# Patient Record
Sex: Female | Born: 1985 | Race: White | Hispanic: No | Marital: Married | Smoking: Former smoker
Health system: Southern US, Community
[De-identification: ages and names within clinical notes are randomized; demographics above are authoritative.]

## PROBLEM LIST (undated history)

## (undated) ENCOUNTER — Inpatient Hospital Stay (HOSPITAL_COMMUNITY): Payer: Self-pay

## (undated) DIAGNOSIS — F32A Depression, unspecified: Secondary | ICD-10-CM

## (undated) DIAGNOSIS — F419 Anxiety disorder, unspecified: Secondary | ICD-10-CM

## (undated) DIAGNOSIS — F319 Bipolar disorder, unspecified: Secondary | ICD-10-CM

## (undated) DIAGNOSIS — Z87442 Personal history of urinary calculi: Secondary | ICD-10-CM

## (undated) DIAGNOSIS — F329 Major depressive disorder, single episode, unspecified: Secondary | ICD-10-CM

---

## 2006-10-11 ENCOUNTER — Other Ambulatory Visit: Admission: RE | Admit: 2006-10-11 | Discharge: 2006-10-11 | Payer: Self-pay | Admitting: Obstetrics and Gynecology

## 2007-01-09 ENCOUNTER — Ambulatory Visit (HOSPITAL_COMMUNITY): Admission: RE | Admit: 2007-01-09 | Discharge: 2007-01-09 | Payer: Self-pay | Admitting: Obstetrics and Gynecology

## 2007-03-15 ENCOUNTER — Inpatient Hospital Stay (HOSPITAL_COMMUNITY): Admission: AD | Admit: 2007-03-15 | Discharge: 2007-03-15 | Payer: Self-pay | Admitting: Obstetrics and Gynecology

## 2007-05-14 ENCOUNTER — Inpatient Hospital Stay (HOSPITAL_COMMUNITY): Admission: AD | Admit: 2007-05-14 | Discharge: 2007-05-14 | Payer: Self-pay | Admitting: Family Medicine

## 2007-05-26 ENCOUNTER — Inpatient Hospital Stay (HOSPITAL_COMMUNITY): Admission: AD | Admit: 2007-05-26 | Discharge: 2007-05-29 | Payer: Self-pay | Admitting: Obstetrics and Gynecology

## 2008-08-15 ENCOUNTER — Emergency Department (HOSPITAL_COMMUNITY): Admission: EM | Admit: 2008-08-15 | Discharge: 2008-08-15 | Payer: Self-pay | Admitting: Emergency Medicine

## 2009-02-01 DIAGNOSIS — Z87442 Personal history of urinary calculi: Secondary | ICD-10-CM

## 2009-02-01 HISTORY — DX: Personal history of urinary calculi: Z87.442

## 2009-05-14 ENCOUNTER — Inpatient Hospital Stay (HOSPITAL_COMMUNITY): Admission: AD | Admit: 2009-05-14 | Discharge: 2009-05-14 | Payer: Self-pay | Admitting: Obstetrics & Gynecology

## 2009-05-31 ENCOUNTER — Inpatient Hospital Stay (HOSPITAL_COMMUNITY): Admission: AD | Admit: 2009-05-31 | Discharge: 2009-05-31 | Payer: Self-pay | Admitting: Obstetrics and Gynecology

## 2009-06-05 ENCOUNTER — Inpatient Hospital Stay (HOSPITAL_COMMUNITY): Admission: AD | Admit: 2009-06-05 | Discharge: 2009-06-06 | Payer: Self-pay | Admitting: Obstetrics & Gynecology

## 2009-12-02 HISTORY — PX: KIDNEY STONE SURGERY: SHX686

## 2009-12-16 ENCOUNTER — Observation Stay (HOSPITAL_COMMUNITY): Admission: AD | Admit: 2009-12-16 | Discharge: 2009-12-17 | Payer: Self-pay | Admitting: Urology

## 2009-12-16 ENCOUNTER — Emergency Department (HOSPITAL_COMMUNITY): Admission: EM | Admit: 2009-12-16 | Discharge: 2009-12-16 | Payer: Self-pay | Admitting: Emergency Medicine

## 2009-12-17 ENCOUNTER — Encounter (INDEPENDENT_AMBULATORY_CARE_PROVIDER_SITE_OTHER): Payer: Self-pay | Admitting: Urology

## 2010-02-01 ENCOUNTER — Emergency Department (HOSPITAL_COMMUNITY)
Admission: EM | Admit: 2010-02-01 | Discharge: 2010-02-01 | Payer: Self-pay | Source: Home / Self Care | Admitting: Emergency Medicine

## 2010-02-01 NOTE — L&D Delivery Note (Signed)
Delivery Note At 6:12 PM a viable female was delivered via Vaginal, Spontaneous Delivery (Presentation: Left Occiput Anterior).  APGAR: 9, 9; weight 7 lb 11.3 oz (3496 g).   Placenta status: Intact, Spontaneous.  Cord: 3 vessels  Pt presented to L&D from mau and found to be anterior lip with bulging bag.  Arom for clear fluid. Pt delivered with one push after arom. Infant was passed to the maternal chest and cried vigorously.  Placenta delivered spontaneously, intact, with 3VC.  No lacerations required repair.  Mother and baby doing well after delivery. 10mg  IM pitocin given d/t no IV access at time of delivery  Anesthesia: None  Episiotomy: None Lacerations: None Est. Blood Loss (mL): 250  Mom to postpartum.  Baby to nursery-stable.  Misty Gentry H. 11/27/2010, 6:26 PM

## 2010-04-07 ENCOUNTER — Other Ambulatory Visit: Payer: Self-pay | Admitting: Obstetrics and Gynecology

## 2010-04-13 LAB — URINALYSIS, ROUTINE W REFLEX MICROSCOPIC
Bilirubin Urine: NEGATIVE
Nitrite: NEGATIVE
Specific Gravity, Urine: 1.023 (ref 1.005–1.030)
pH: 8 (ref 5.0–8.0)

## 2010-04-13 LAB — URINE MICROSCOPIC-ADD ON

## 2010-04-13 LAB — HCG, QUANTITATIVE, PREGNANCY: hCG, Beta Chain, Quant, S: 4 m[IU]/mL (ref <5)

## 2010-04-14 LAB — URINALYSIS, ROUTINE W REFLEX MICROSCOPIC
Bilirubin Urine: NEGATIVE
Glucose, UA: NEGATIVE mg/dL
Ketones, ur: NEGATIVE mg/dL
Nitrite: NEGATIVE
Specific Gravity, Urine: 1.013 (ref 1.005–1.030)
pH: 6.5 (ref 5.0–8.0)

## 2010-04-14 LAB — CBC
HCT: 36.9 % (ref 36.0–46.0)
Hemoglobin: 13.1 g/dL (ref 12.0–15.0)
MCH: 31.3 pg (ref 26.0–34.0)
MCHC: 35.4 g/dL (ref 30.0–36.0)
RBC: 4.17 MIL/uL (ref 3.87–5.11)

## 2010-04-14 LAB — BASIC METABOLIC PANEL
CO2: 24 mEq/L (ref 19–32)
Chloride: 104 mEq/L (ref 96–112)
GFR calc Af Amer: 35 mL/min — ABNORMAL LOW (ref >60)
Glucose, Bld: 102 mg/dL — ABNORMAL HIGH (ref 70–99)
Potassium: 4 mEq/L (ref 3.5–5.1)
Sodium: 138 mEq/L (ref 135–145)

## 2010-04-14 LAB — STONE ANALYSIS: Stone Weight KSTONE: 0.02 g

## 2010-04-14 LAB — URINE MICROSCOPIC-ADD ON

## 2010-04-14 LAB — URINE CULTURE

## 2010-04-21 LAB — CBC
HCT: 34.4 % — ABNORMAL LOW (ref 36.0–46.0)
HCT: 39.3 % (ref 36.0–46.0)
Hemoglobin: 13.8 g/dL (ref 12.0–15.0)
MCHC: 35.2 g/dL (ref 30.0–36.0)
MCHC: 35.5 g/dL (ref 30.0–36.0)
MCV: 91.6 fL (ref 78.0–100.0)
RBC: 3.75 MIL/uL — ABNORMAL LOW (ref 3.87–5.11)
RBC: 4.28 MIL/uL (ref 3.87–5.11)
RDW: 12.9 % (ref 11.5–15.5)
WBC: 11.9 10*3/uL — ABNORMAL HIGH (ref 4.0–10.5)

## 2010-04-22 LAB — WET PREP, GENITAL: Yeast Wet Prep HPF POC: NONE SEEN

## 2010-04-22 LAB — RPR: RPR: NONREACTIVE

## 2010-04-22 LAB — ANTIBODY SCREEN: Antibody Screen: NEGATIVE

## 2010-04-30 ENCOUNTER — Inpatient Hospital Stay (HOSPITAL_COMMUNITY)
Admission: AD | Admit: 2010-04-30 | Discharge: 2010-04-30 | Disposition: A | Discharge status: Home / Self Care | Payer: Medicaid Other | Source: Ambulatory Visit | Attending: Obstetrics and Gynecology | Admitting: Obstetrics and Gynecology

## 2010-04-30 DIAGNOSIS — O99891 Other specified diseases and conditions complicating pregnancy: Secondary | ICD-10-CM | POA: Insufficient documentation

## 2010-04-30 DIAGNOSIS — R109 Unspecified abdominal pain: Secondary | ICD-10-CM | POA: Insufficient documentation

## 2010-04-30 LAB — URINE MICROSCOPIC-ADD ON

## 2010-04-30 LAB — URINALYSIS, ROUTINE W REFLEX MICROSCOPIC
Leukocytes, UA: NEGATIVE
Nitrite: NEGATIVE
Specific Gravity, Urine: >1.03 — ABNORMAL HIGH (ref 1.005–1.030)
Urobilinogen, UA: 0.2 mg/dL (ref 0.0–1.0)
pH: 6 (ref 5.0–8.0)

## 2010-04-30 LAB — POCT PREGNANCY, URINE: Preg Test, Ur: POSITIVE

## 2010-05-04 NOTE — Discharge Summary (Signed)
  NAME:  IRONS, Prairie                ACCOUNT NO.:  [id number]  MEDICAL RECORD NO.:  [id number]          PATIENT TYPE:  OBV  LOCATION:  1426                         FACILITY:  [facility]  PHYSICIAN:  Valetta Fuller, M.D.  DATE OF BIRTH:  1985/11/08  DATE OF ADMISSION:  12/16/2009 DATE OF DISCHARGE:  12/17/2009                              DISCHARGE SUMMARY   DISCHARGE DIAGNOSES: 1. Ureteral calculus. 2. Hydronephrosis.  PROCEDURE PERFORMED:  Cystoscopy, right retrograde pyelogram, ureteroscopy, holmium laser lithotripsy, basketing the fragments, and right double-J stent placement on December 17, 2009.  HISTORY OF PRESENT ILLNESS:  Ms. Raj Janus is 25 years of age.  She had been seen in the emergency room a few days prior to admission with right flank pain and was diagnosed with a 13-mm obstructing right ureteral calculus.  She subsequently came to our office continuing to complain of significant pain with probable dehydration.  She was admitted to [facility] on December 16, 2009, for some supportive pain, pain control, and hydration.  She then was taken the next morning to surgery.  At the time of ureteroscopy, a large stone was encountered.  This was fracture with holmium laser lithotriptor and all fragments were removed successfully.  A double-J stent was placed and no obvious complications or problems occurred.  The patient was able to be discharged that afternoon.  DISCHARGE INSTRUCTIONS:  The patient was discharged home on her normal medications listed.  She was given adequate pain medication.  FOLLOWUP:  Follow up will be arranged in our office for double-J stent removal and clinical reassessment.     Valetta Fuller, M.D.     DSG/MEDQ  D:  04/29/2010  T:  04/29/2010  Job:  [id number]  Electronically Signed by Barron Alvine M.D. on 05/04/2010 12:06:43 PM

## 2010-05-11 LAB — URINALYSIS, ROUTINE W REFLEX MICROSCOPIC
Bilirubin Urine: NEGATIVE
Glucose, UA: NEGATIVE mg/dL
Hgb urine dipstick: NEGATIVE
Ketones, ur: NEGATIVE mg/dL
Protein, ur: NEGATIVE mg/dL
pH: 6.5 (ref 5.0–8.0)

## 2010-05-11 LAB — WET PREP, GENITAL
Trich, Wet Prep: NONE SEEN
Yeast Wet Prep HPF POC: NONE SEEN

## 2010-05-11 LAB — GC/CHLAMYDIA PROBE AMP, GENITAL: Chlamydia, DNA Probe: NEGATIVE

## 2010-06-19 NOTE — Discharge Summary (Signed)
NAME:  Misty Gentry, Misty Gentry                ACCOUNT NO.:  [id number]   MEDICAL RECORD NO.:  [id number]          PATIENT TYPE:  INP   LOCATION:  9106                          FACILITY:  [facility]   PHYSICIAN:  Malva Limes, M.D.    DATE OF BIRTH:  [birth year]   DATE OF ADMISSION:  05/26/2007  DATE OF DISCHARGE:  05/29/2007                               DISCHARGE SUMMARY   FINAL DIAGNOSES:  1. Intrauterine pregnancy at term.  2. Spontaneous rupture of membranes, active labor.   PROCEDURE:  Vacuum-assisted vaginal delivery of a female infant with  Apgars of 7 and 9.  Delivery performed by Dr. Ilda Mori.   COMPLICATIONS:  None.   HISTORY:  This 24 year old G1, P0 presents at 85 plus weeks' gestation  with rupture of membranes.  The patient's antepartum course up to this  point had been uncomplicated.  She did transfer obstetric care to [place]  from Dr. Ashley Royalty in the end of her first trimester.  Otherwise, she has  had an uncomplicated antepartum course.  The patient is admitted at this  time.  The patient dilated to complete.  She was pushing for over an  hour and half, and was getting exhausted.  At this point, Dr. Arlyce Dice  applied a vacuum for assistance.  The patient had the delivery with the  second pull of a 8 pounds 0 ounce female infant with Apgars of  7 and 9.  There was some mild-to-moderate shoulder dystocia that was relieved with  McRoberts.  She delivered without any lacerations.  The baby did have  some slight decreased tone in the left arm.  The procedure went without  complications.  The patient's postpartum course was benign without any  significant fevers.  The patient was felt ready for discharge on  postpartum day #2.  The patient was sent home on a regular diet, told to  decrease activities, told to continue her prenatal vitamins and stool  softener as needed, and was given Percocet 5 mg 1-2 every 4-6 hours as  needed for pain.  Told to continue over-the-counter ibuprofen up to  600  mg every 6 hours as needed for pain.  She was to follow up in our office  in 6 weeks.  Instructions and precautions were reviewed with the  patient.   LABORATORY DATA:  Labs on discharge, the patient had a hemoglobin of  10.2, white blood cell count of 16.4, and platelets of 213,000.      Leilani Able, P.A.-C.    ______________________________  Malva Limes, M.D.    MB/MEDQ  D:  06/14/2007  T:  06/15/2007  Job:  [id number]

## 2010-10-07 ENCOUNTER — Other Ambulatory Visit: Payer: Self-pay | Admitting: Obstetrics & Gynecology

## 2010-10-07 DIAGNOSIS — R1901 Right upper quadrant abdominal swelling, mass and lump: Secondary | ICD-10-CM

## 2010-10-09 ENCOUNTER — Other Ambulatory Visit: Payer: Self-pay | Admitting: Obstetrics & Gynecology

## 2010-10-09 ENCOUNTER — Ambulatory Visit
Admission: RE | Admit: 2010-10-09 | Discharge: 2010-10-09 | Disposition: A | Discharge status: Home / Self Care | Payer: Medicaid Other | Source: Ambulatory Visit | Attending: Obstetrics & Gynecology | Admitting: Obstetrics & Gynecology

## 2010-10-09 DIAGNOSIS — R1901 Right upper quadrant abdominal swelling, mass and lump: Secondary | ICD-10-CM

## 2010-10-23 LAB — URINALYSIS, ROUTINE W REFLEX MICROSCOPIC
Bilirubin Urine: NEGATIVE
Ketones, ur: NEGATIVE
Nitrite: NEGATIVE
Protein, ur: NEGATIVE
Specific Gravity, Urine: 1.015
Urobilinogen, UA: 0.2

## 2010-10-23 LAB — URINE CULTURE
Colony Count: NO GROWTH
Culture: NO GROWTH

## 2010-10-27 LAB — CBC
HCT: 29 — ABNORMAL LOW
Hemoglobin: 10.2 — ABNORMAL LOW
Platelets: 213
Platelets: 255
RBC: 3.72 — ABNORMAL LOW
WBC: 12.8 — ABNORMAL HIGH
WBC: 16.4 — ABNORMAL HIGH

## 2010-10-27 LAB — RPR: RPR Ser Ql: NONREACTIVE

## 2010-10-31 ENCOUNTER — Inpatient Hospital Stay (HOSPITAL_COMMUNITY)
Admission: AD | Admit: 2010-10-31 | Discharge: 2010-11-03 | DRG: 781 | Disposition: A | Discharge status: Home / Self Care | Payer: Medicaid Other | Source: Ambulatory Visit | Attending: Obstetrics and Gynecology | Admitting: Obstetrics and Gynecology

## 2010-10-31 ENCOUNTER — Inpatient Hospital Stay (HOSPITAL_COMMUNITY): Payer: Medicaid Other

## 2010-10-31 ENCOUNTER — Encounter (HOSPITAL_COMMUNITY): Payer: Self-pay | Admitting: Obstetrics and Gynecology

## 2010-10-31 ENCOUNTER — Emergency Department (HOSPITAL_COMMUNITY)
Admission: EM | Admit: 2010-10-31 | Discharge: 2010-10-31 | Disposition: A | Discharge status: Nursing Home (Includes ICF) | Payer: Medicaid Other | Source: Home / Self Care | Attending: Emergency Medicine | Admitting: Emergency Medicine

## 2010-10-31 DIAGNOSIS — N1330 Unspecified hydronephrosis: Secondary | ICD-10-CM | POA: Diagnosis present

## 2010-10-31 DIAGNOSIS — N201 Calculus of ureter: Secondary | ICD-10-CM | POA: Diagnosis present

## 2010-10-31 DIAGNOSIS — N23 Unspecified renal colic: Secondary | ICD-10-CM

## 2010-10-31 DIAGNOSIS — O26839 Pregnancy related renal disease, unspecified trimester: Principal | ICD-10-CM | POA: Diagnosis present

## 2010-10-31 DIAGNOSIS — N200 Calculus of kidney: Secondary | ICD-10-CM | POA: Diagnosis present

## 2010-10-31 DIAGNOSIS — R109 Unspecified abdominal pain: Secondary | ICD-10-CM | POA: Diagnosis present

## 2010-10-31 DIAGNOSIS — O4700 False labor before 37 completed weeks of gestation, unspecified trimester: Secondary | ICD-10-CM | POA: Diagnosis present

## 2010-10-31 HISTORY — DX: Personal history of urinary calculi: Z87.442

## 2010-10-31 HISTORY — DX: Depression, unspecified: F32.A

## 2010-10-31 HISTORY — DX: Major depressive disorder, single episode, unspecified: F32.9

## 2010-10-31 LAB — URINE MICROSCOPIC-ADD ON

## 2010-10-31 LAB — CBC
HCT: 36.7 % (ref 36.0–46.0)
Hemoglobin: 12.5 g/dL (ref 12.0–15.0)
Hemoglobin: 11 g/dL — ABNORMAL LOW (ref 12.0–15.0)
MCHC: 33.6 g/dL (ref 30.0–36.0)
MCV: 88.6 fL (ref 78.0–100.0)
RBC: 4.14 MIL/uL (ref 3.87–5.11)
RDW: 13.4 % (ref 11.5–15.5)
WBC: 13.5 10*3/uL — ABNORMAL HIGH (ref 4.0–10.5)

## 2010-10-31 LAB — URINALYSIS, ROUTINE W REFLEX MICROSCOPIC
Bilirubin Urine: NEGATIVE
Nitrite: NEGATIVE
Specific Gravity, Urine: 1.025 (ref 1.005–1.030)
Urobilinogen, UA: 0.2 mg/dL (ref 0.0–1.0)
pH: 6.5 (ref 5.0–8.0)

## 2010-10-31 LAB — COMPREHENSIVE METABOLIC PANEL
Albumin: 2.9 g/dL — ABNORMAL LOW (ref 3.5–5.2)
Alkaline Phosphatase: 114 U/L (ref 39–117)
BUN: 10 mg/dL (ref 6–23)
CO2: 22 mEq/L (ref 19–32)
Chloride: 99 mEq/L (ref 96–112)
Creatinine, Ser: 0.74 mg/dL (ref 0.50–1.10)
GFR calc Af Amer: >60 mL/min (ref >60)
GFR calc non Af Amer: >60 mL/min (ref >60)
Glucose, Bld: 116 mg/dL — ABNORMAL HIGH (ref 70–99)
Potassium: 3.6 mEq/L (ref 3.5–5.1)
Total Bilirubin: 0.3 mg/dL (ref 0.3–1.2)

## 2010-10-31 LAB — DIFFERENTIAL
Basophils Absolute: 0 10*3/uL (ref 0.0–0.1)
Eosinophils Relative: 0 % (ref 0–5)
Lymphocytes Relative: 13 % (ref 12–46)
Lymphs Abs: 1.8 10*3/uL (ref 0.7–4.0)
Neutro Abs: 11 10*3/uL — ABNORMAL HIGH (ref 1.7–7.7)

## 2010-10-31 LAB — WET PREP, GENITAL

## 2010-10-31 MED ORDER — ONDANSETRON HCL 4 MG/2ML IJ SOLN
4.0000 mg | Freq: Four times a day (QID) | INTRAMUSCULAR | Status: DC | PRN
  Administered 2010-10-31 – 2010-11-01 (×3): 4 mg via INTRAVENOUS
  Filled 2010-10-31 (×2): qty 2

## 2010-10-31 MED ORDER — HYDROMORPHONE 0.3 MG/ML IV SOLN
INTRAVENOUS | Status: DC
  Administered 2010-10-31 (×2): via INTRAVENOUS
  Administered 2010-11-01: 6.3 mg via INTRAVENOUS
  Administered 2010-11-01 – 2010-11-02 (×3): via INTRAVENOUS

## 2010-10-31 MED ORDER — NALOXONE HCL 0.4 MG/ML IJ SOLN: 0.4000 mg | INTRAMUSCULAR | Status: DC | PRN

## 2010-10-31 MED ORDER — ONDANSETRON HCL 4 MG/2ML IJ SOLN
4.0000 mg | Freq: Three times a day (TID) | INTRAMUSCULAR | Status: DC | PRN
  Filled 2010-10-31 (×2): qty 2

## 2010-10-31 MED ORDER — HYDROMORPHONE 0.3 MG/ML IV SOLN
INTRAVENOUS | Status: AC
  Filled 2010-10-31: qty 25

## 2010-10-31 MED ORDER — CALCIUM CARBONATE ANTACID 500 MG PO CHEW
2.0000 | CHEWABLE_TABLET | ORAL | Status: DC | PRN
  Administered 2010-10-31 – 2010-11-02 (×2): 400 mg via ORAL
  Filled 2010-10-31 (×2): qty 2

## 2010-10-31 MED ORDER — ONDANSETRON HCL 4 MG/2ML IJ SOLN
8.0000 mg | Freq: Three times a day (TID) | INTRAMUSCULAR | Status: DC | PRN
  Filled 2010-10-31: qty 4

## 2010-10-31 MED ORDER — DIPHENHYDRAMINE HCL 50 MG/ML IJ SOLN: 12.5000 mg | Freq: Four times a day (QID) | INTRAMUSCULAR | Status: DC | PRN

## 2010-10-31 MED ORDER — TAMSULOSIN HCL 0.4 MG PO CAPS
0.4000 mg | ORAL_CAPSULE | Freq: Every day | ORAL | Status: DC
  Administered 2010-10-31 – 2010-11-03 (×4): 0.4 mg via ORAL
  Filled 2010-10-31 (×5): qty 1

## 2010-10-31 MED ORDER — DOCUSATE SODIUM 100 MG PO CAPS: 100.0000 mg | ORAL_CAPSULE | Freq: Every day | ORAL | Status: DC

## 2010-10-31 MED ORDER — SODIUM CHLORIDE 0.9 % IJ SOLN: 9.0000 mL | INTRAMUSCULAR | Status: DC | PRN

## 2010-10-31 MED ORDER — ZOLPIDEM TARTRATE 10 MG PO TABS: 10.0000 mg | ORAL_TABLET | Freq: Every evening | ORAL | Status: DC | PRN

## 2010-10-31 MED ORDER — LACTATED RINGERS IV SOLN
INTRAVENOUS | Status: DC
  Administered 2010-10-31: 1000 mL via INTRAVENOUS

## 2010-10-31 MED ORDER — DIPHENHYDRAMINE HCL 12.5 MG/5ML PO ELIX
12.5000 mg | ORAL_SOLUTION | Freq: Four times a day (QID) | ORAL | Status: DC | PRN
  Filled 2010-10-31: qty 5

## 2010-10-31 MED ORDER — PRENATAL PLUS 27-1 MG PO TABS
1.0000 | ORAL_TABLET | Freq: Every day | ORAL | Status: DC
  Administered 2010-11-01 – 2010-11-03 (×3): 1 via ORAL
  Filled 2010-10-31 (×4): qty 1

## 2010-10-31 MED ORDER — ACETAMINOPHEN 325 MG PO TABS: 650.0000 mg | ORAL_TABLET | ORAL | Status: DC | PRN

## 2010-10-31 MED ORDER — HYDROMORPHONE HCL 1 MG/ML IJ SOLN
1.0000 mg | INTRAMUSCULAR | Status: DC | PRN
  Administered 2010-10-31: 1 mg via INTRAVENOUS
  Administered 2010-10-31 (×2): 2 mg via INTRAVENOUS
  Filled 2010-10-31 (×2): qty 2
  Filled 2010-10-31: qty 1

## 2010-10-31 MED ORDER — LACTATED RINGERS IV SOLN
INTRAVENOUS | Status: DC
  Administered 2010-10-31 – 2010-11-01 (×4): 1000 mL via INTRAVENOUS
  Administered 2010-11-01 – 2010-11-02 (×3): via INTRAVENOUS

## 2010-10-31 MED ORDER — DOCUSATE SODIUM 100 MG PO CAPS
100.0000 mg | ORAL_CAPSULE | Freq: Every day | ORAL | Status: DC
  Administered 2010-11-01 – 2010-11-03 (×3): 100 mg via ORAL
  Filled 2010-10-31 (×4): qty 1

## 2010-10-31 MED ORDER — PRENATAL PLUS 27-1 MG PO TABS: 1.0000 | ORAL_TABLET | Freq: Every day | ORAL | Status: DC

## 2010-10-31 MED ORDER — ONDANSETRON HCL 4 MG/2ML IJ SOLN: 8.0000 mg | Freq: Three times a day (TID) | INTRAMUSCULAR | Status: DC | PRN

## 2010-10-31 NOTE — Consult Note (Signed)
NAME:  Misty Gentry, Misty Gentry                ACCOUNT NO.:  [id number]  MEDICAL RECORD NO.:  [id number]  LOCATION:  9152                          FACILITY:  [facility]  PHYSICIAN:  Esvin Hnat I. Patsi Sears, M.D.DATE OF BIRTH:  [birth year]  DATE OF CONSULTATION: DATE OF DISCHARGE:                                CONSULTATION   SUBJECTIVE:  This 25 year old single female, gravida 4, para 0 who is now 70 and a half weeks' pregnant was admitted to [facility] with left flank pain for 24 hours, which has been intermittent over the last 6 days.  No fever, no chills.  No nausea, no vomiting.  The patient was evaluated by her gynecologist and started on antibiotics for possible urinary tract infection approximately 6 days ago.  However, ultrasound now shows a 7-mm left ureterovesical junction stone, as well as a 1-cm left upper pole stone.  Note, the patient's past history of lithotripsy in 2011 per Dr. Isabel Caprice.  PAST MEDICAL HISTORY:  Significant for 1. Depression. 2. Substance abuse.  FAMILY HISTORY:  Noncontributory.  DIETARY:  The patient drinks multiple soft drinks per day, along with multiple sweet drinks as well.  REVIEW OF SYSTEMS:  Constitutional review of systems include flank pain, left, without nausea, vomiting.  PHYSICAL EXAMINATION:  GENERAL:  Shows a well-developed, well-nourished female in no acute distress. VITAL SIGNS:  Blood pressure is 110/63, temperature 97.9, pulse 87, respiratory rate 18, O2 sats 100% on room air. PELVIC:  Shows abdominal wall pregnancy.  There was some left CVA pain.  LABORATORY DATA:  Urinalysis shows diminished count red blood cells. GFR greater than 60.  White blood cell count 13,500.  Urine pH is 6.5. Dipstick is negative.  ASSESSMENT:  A 7-mm distal left ureteral calculus with a 1-cm left upper pole stone and significant microhematuria.  The patient has a history of significant substance abuse.  PLAN:  She will be started on Flomax 0.4 mg one p.o. per  day, and we will strain urine.  We will notify Dr. Isabel Caprice on Monday.     Vinod Mikesell I. Patsi Sears, M.D.     SIT/MEDQ  D:  10/31/2010  T:  10/31/2010  Job:  [id number]  cc:   Valetta Fuller, MD Fax: [phone]  Philip Aspen, DO Fax: [phone]

## 2010-10-31 NOTE — Progress Notes (Signed)
Pt sitting on side of the bed. Unable to get comfortable in the bed. Nauseated/ vomit bag at mouth. Requests pain medication.

## 2010-10-31 NOTE — Progress Notes (Signed)
Dr. Claiborne Billings notified of renal US results. Plan to admit to ante. Telephone orders received.

## 2010-10-31 NOTE — Progress Notes (Signed)
Dr. Claiborne Billings notified of patient arrival to MAU with left flank pain. Pt transferred from [facility] and is here for monitoring and Renal US. Orders received from Dr. Claiborne Billings, pain management in place.
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
## 2010-11-01 LAB — URINE CULTURE
Culture  Setup Time: 201209291128
Culture: NO GROWTH

## 2010-11-01 LAB — CBC
HCT: 30.1 % — ABNORMAL LOW (ref 36.0–46.0)
Hemoglobin: 10 g/dL — ABNORMAL LOW (ref 12.0–15.0)
WBC: 7.4 10*3/uL (ref 4.0–10.5)

## 2010-11-01 MED ORDER — HYDROMORPHONE 0.3 MG/ML IV SOLN
INTRAVENOUS | Status: AC
  Filled 2010-11-01: qty 25

## 2010-11-01 MED ORDER — NITROFURANTOIN MONOHYD MACRO 100 MG PO CAPS
100.0000 mg | ORAL_CAPSULE | Freq: Two times a day (BID) | ORAL | Status: AC
  Administered 2010-11-01 (×2): 100 mg via ORAL
  Filled 2010-11-01 (×3): qty 1

## 2010-11-01 NOTE — Progress Notes (Addendum)
25 yo @ 34.5 admitted yesterday for L sided 7mm kidney stone at UV junction. She is eating and voiding without difficulty and denies any other complaints.  +FM, no bleeding, no LOF.  AF VSS.  FHT 125 mod var, +A, reactive TOCO: acontractile SVE deferred S/p evaluation by Dr. Marcello Fennel who recommended Flomax 0.4mg  daily, Dr. Isabel Caprice to reevaluated tomorrow.  Pt is straining urine and her pain is well controlled with Dilaudid PCA.    T Today is last day of 7-day course of Macrobid started after office visit showed suspected UTI. Continue current management.

## 2010-11-02 LAB — COMPREHENSIVE METABOLIC PANEL
ALT: 6 U/L (ref 0–35)
AST: 10 U/L (ref 0–37)
Creatinine, Ser: 0.73 mg/dL (ref 0.50–1.10)
GFR calc non Af Amer: >90 mL/min (ref >90)
Potassium: 4.2 mEq/L (ref 3.5–5.1)
Total Bilirubin: 0.3 mg/dL (ref 0.3–1.2)

## 2010-11-02 LAB — CBC
HCT: 30.8 % — ABNORMAL LOW (ref 36.0–46.0)
Hemoglobin: 10.3 g/dL — ABNORMAL LOW (ref 12.0–15.0)
MCH: 30.2 pg (ref 26.0–34.0)
MCHC: 33.4 g/dL (ref 30.0–36.0)
RDW: 13.7 % (ref 11.5–15.5)

## 2010-11-02 MED ORDER — HYDROMORPHONE 0.3 MG/ML IV SOLN
INTRAVENOUS | Status: AC
  Filled 2010-11-02: qty 25

## 2010-11-02 MED ORDER — HYDROMORPHONE HCL 2 MG PO TABS
2.0000 mg | ORAL_TABLET | ORAL | Status: DC | PRN
  Administered 2010-11-03 (×3): 2 mg via ORAL
  Filled 2010-11-02 (×3): qty 1

## 2010-11-02 MED ORDER — NIFEDIPINE 10 MG PO CAPS
10.0000 mg | ORAL_CAPSULE | Freq: Once | ORAL | Status: AC
  Administered 2010-11-02: 10 mg via ORAL
  Filled 2010-11-02: qty 1

## 2010-11-02 MED ORDER — HYDROMORPHONE HCL 2 MG PO TABS
2.0000 mg | ORAL_TABLET | Freq: Four times a day (QID) | ORAL | Status: DC | PRN
  Administered 2010-11-02: 2 mg via ORAL
  Filled 2010-11-02: qty 1

## 2010-11-02 NOTE — Progress Notes (Signed)
Pt still using high doses of Dilaudid.  States now also has some RLQ pain.  Good FM. No contractions. VVSAF IMP/ 34week preg with ureteral calculus. Plan/ Await urology input.  May try p.o. meds later today.

## 2010-11-02 NOTE — Progress Notes (Signed)
Pt was noted to have some contractions.  Treated with Procardia 10mg  x 1.  Now improved.

## 2010-11-02 NOTE — Progress Notes (Signed)
MD notified of pts request for 2nd ultrasound to verify whether she has kidney stones on the right side, due to increased pain on the right side. U/s for in the am.

## 2010-11-02 NOTE — Progress Notes (Signed)
UR chart review completed.  

## 2010-11-03 ENCOUNTER — Inpatient Hospital Stay (HOSPITAL_COMMUNITY): Payer: Medicaid Other

## 2010-11-03 LAB — URINALYSIS, ROUTINE W REFLEX MICROSCOPIC
Bilirubin Urine: NEGATIVE
Glucose, UA: NEGATIVE mg/dL
Ketones, ur: NEGATIVE mg/dL
pH: 7.5 (ref 5.0–8.0)

## 2010-11-03 LAB — URINE MICROSCOPIC-ADD ON

## 2010-11-03 MED ORDER — HYDROMORPHONE HCL 2 MG PO TABS: 2.0000 mg | ORAL_TABLET | ORAL | Status: AC | PRN

## 2010-11-03 NOTE — Discharge Summary (Signed)
Obstetric Discharge Summary Reason for Admission: Pain management for renal colic Prenatal Procedures: ultrasound Hospital Course:         Patient admitted with renal colic.  Ultrasound confirmed left renal calculi and right moderate hydronephrosis.  Ureteral jets were seen from the right and left ureters.  Consultation with Urology suggested conservative management unless pain became intolerable.  On the 3rd hospital day the patient was stable and her pain was controlled with p.o. Dilaudid.  She will be followed in the office.    Hemoglobin  Date Value Range Status  11/02/2010 10.3* 12.0-15.0 (g/dL) Final     HCT  Date Value Range Status  11/02/2010 30.8* 36.0-46.0 (%) Final    Discharge Diagnoses: Renal colic  Discharge Information: Date: 11/03/2010 Activity: unrestricted Diet: routine Medications: Dilaudid 2mg  p.o. q4h prn pain (#10) Condition: stable Instructions: kidney stone booklet Discharge to: home Follow-up Information    Follow up with [facility]. Make an appointment in 1 week.         Misty Gentry D 11/03/2010, 6:03 PM

## 2010-11-10 LAB — STREP B DNA PROBE: GBS: NEGATIVE

## 2010-11-19 ENCOUNTER — Encounter (HOSPITAL_COMMUNITY): Payer: Self-pay | Admitting: *Deleted

## 2010-11-19 ENCOUNTER — Inpatient Hospital Stay (HOSPITAL_COMMUNITY): Payer: Medicaid Other

## 2010-11-19 ENCOUNTER — Inpatient Hospital Stay (HOSPITAL_COMMUNITY)
Admission: AD | Admit: 2010-11-19 | Discharge: 2010-11-19 | Disposition: A | Discharge status: Home / Self Care | Payer: Medicaid Other | Source: Ambulatory Visit | Attending: Obstetrics and Gynecology | Admitting: Obstetrics and Gynecology

## 2010-11-19 DIAGNOSIS — O4690 Antepartum hemorrhage, unspecified, unspecified trimester: Secondary | ICD-10-CM | POA: Insufficient documentation

## 2010-11-19 DIAGNOSIS — O479 False labor, unspecified: Secondary | ICD-10-CM | POA: Insufficient documentation

## 2010-11-19 LAB — CBC
Hemoglobin: 11 g/dL — ABNORMAL LOW (ref 12.0–15.0)
RBC: 3.66 MIL/uL — ABNORMAL LOW (ref 3.87–5.11)

## 2010-11-19 LAB — POCT FERN TEST: Fern Test: NEGATIVE

## 2010-11-19 NOTE — Progress Notes (Signed)
Pt in ultrasound

## 2010-11-19 NOTE — ED Provider Notes (Signed)
History   Pt presents today c/o one episode of vag bleeding. She states it only last a short time and since that time she has noticed a clear fluid. She reports GFM. She denies any other problems at this time.  Chief Complaint  Patient presents with  . Labor Eval   HPI  OB History    Grav Para Term Preterm Abortions TAB SAB Ect Mult Living   4 2 2  1  1   2       Past Medical History  Diagnosis Date  . Personal history of kidney stones 2011  . Depression     Past Surgical History  Procedure Date  . Kidney stone surgery 12/2009    No family history on file.  History  Substance Use Topics  . Smoking status: Former Smoker -- 1.0 packs/day for 8.5 years    Types: Cigarettes    Quit date: 10/31/2006  . Smokeless tobacco: Not on file  . Alcohol Use: No     stopped drinking in 2008    Allergies: No Known Allergies  Prescriptions prior to admission  Medication Sig Dispense Refill  . acetaminophen (TYLENOL) 500 MG tablet Take 500 mg by mouth every 6 (six) hours as needed. For pain       . HYDROmorphone (DILAUDID) 2 MG tablet Take 2 mg by mouth every 4 (four) hours as needed.        . prenatal vitamin w/FE, FA (PRENATAL 1 + 1) 27-1 MG TABS Take 1 tablet by mouth daily.          Review of Systems  Constitutional: Negative for fever.  Cardiovascular: Negative for chest pain.  Gastrointestinal: Negative for nausea, vomiting, abdominal pain, diarrhea and constipation.  Genitourinary: Negative for dysuria, urgency, frequency and hematuria.  Neurological: Negative for dizziness and headaches.  Psychiatric/Behavioral: Negative for depression and suicidal ideas.   Physical Exam   Blood pressure 121/70, pulse 106, temperature 98.9 F (37.2 C), temperature source Oral, resp. rate 20, height 5\' 2"  (1.575 m), weight 220 lb (99.791 kg), last menstrual period 02/13/2010.  Physical Exam  Nursing note and vitals reviewed. Constitutional: She is oriented to person, place, and time.  She appears well-developed and well-nourished. No distress.  HENT:  Head: Normocephalic and atraumatic.  Eyes: EOM are normal. Pupils are equal, round, and reactive to light.  GI: Soft. She exhibits no distension. There is no tenderness. There is no rebound and no guarding.  Genitourinary: No bleeding around the vagina. Vaginal discharge found.       No evidence of vag bleeding. Cervix 1/50/-3. Fern test negative.   Neurological: She is alert and oriented to person, place, and time.  Skin: Skin is warm and dry. She is not diaphoretic.  Psychiatric: She has a normal mood and affect. Her behavior is normal. Judgment and thought content normal.    MAU Course  Procedures  Discussed pt with Dr. Henderson Cloud at length. Will get [place] for BPP, AFI, and to check placenta.  Results for orders placed during the hospital encounter of 11/19/10 (from the past 24 hour(s))  POCT FERN TEST     Status: Normal   Collection Time   11/19/10  5:24 PM      Component Value Range   Fern Test Negative    CBC     Status: Abnormal   Collection Time   11/19/10  5:37 PM      Component Value Range   WBC 10.1  4.0 - 10.5 (K/uL)  RBC 3.66 (*) 3.87 - 5.11 (MIL/uL)   Hemoglobin 11.0 (*) 12.0 - 15.0 (g/dL)   HCT 56.2 (*) 13.0 - 46.0 (%)   MCV 89.1  78.0 - 100.0 (fL)   MCH 30.1  26.0 - 34.0 (pg)   MCHC 33.7  30.0 - 36.0 (g/dL)   RDW 86.5  78.4 - 69.6 (%)   Platelets 226  150 - 400 (K/uL)   BPP 8/10. AFI 10.96. No evidence of abruption or previa.  Recheck of cervix shows no change at 1856.  Assessment and Plan  Pregnancy: discussed with pt at length. She has a f/u appt scheduled. Discussed diet, activity, risks, and precautions. Reminded of [facility].  Clinton Gallant. Rice III, DrHSc, MPAS, PA-C  11/19/2010, 5:41 PM   Henrietta Hoover, PA 11/19/10 1857

## 2010-11-19 NOTE — Progress Notes (Signed)
Pt in for labor eval and possible rupture of membranes.  Reports ucs started x3 days ago, states today they have been about 8-12 minutes.  States she had an episode of bright red bleeding down legs at 1530, now having brown, watery discharge.  Reports decreased fetal movement since last night.

## 2010-11-19 NOTE — Progress Notes (Addendum)
Started bleeding about an hour ago.  Been contracting past 3 days,8-92min.  Decreased movement. Denies hx of low lying placenta or previa.

## 2010-11-27 ENCOUNTER — Encounter (HOSPITAL_COMMUNITY): Payer: Self-pay

## 2010-11-27 ENCOUNTER — Inpatient Hospital Stay (HOSPITAL_COMMUNITY)
Admission: AD | Admit: 2010-11-27 | Discharge: 2010-11-29 | DRG: 775 | Disposition: A | Discharge status: Home / Self Care | Payer: Medicaid Other | Source: Ambulatory Visit | Attending: Obstetrics and Gynecology | Admitting: Obstetrics and Gynecology

## 2010-11-27 MED ORDER — SIMETHICONE 80 MG PO CHEW: 80.0000 mg | CHEWABLE_TABLET | ORAL | Status: DC | PRN

## 2010-11-27 MED ORDER — WITCH HAZEL-GLYCERIN EX PADS: 1.0000 "application " | MEDICATED_PAD | CUTANEOUS | Status: DC | PRN

## 2010-11-27 MED ORDER — IBUPROFEN 600 MG PO TABS
600.0000 mg | ORAL_TABLET | Freq: Four times a day (QID) | ORAL | Status: DC | PRN
  Administered 2010-11-27: 600 mg via ORAL
  Filled 2010-11-27: qty 1

## 2010-11-27 MED ORDER — METHYLERGONOVINE MALEATE 0.2 MG PO TABS: 0.2000 mg | ORAL_TABLET | ORAL | Status: DC | PRN

## 2010-11-27 MED ORDER — SENNOSIDES-DOCUSATE SODIUM 8.6-50 MG PO TABS
2.0000 | ORAL_TABLET | Freq: Every day | ORAL | Status: DC
  Administered 2010-11-27: 2 via ORAL

## 2010-11-27 MED ORDER — DIPHENHYDRAMINE HCL 25 MG PO CAPS: 25.0000 mg | ORAL_CAPSULE | Freq: Four times a day (QID) | ORAL | Status: DC | PRN

## 2010-11-27 MED ORDER — BENZOCAINE-MENTHOL 20-0.5 % EX AERO
INHALATION_SPRAY | CUTANEOUS | Status: AC
  Filled 2010-11-27: qty 56

## 2010-11-27 MED ORDER — DIBUCAINE 1 % RE OINT: 1.0000 "application " | TOPICAL_OINTMENT | RECTAL | Status: DC | PRN

## 2010-11-27 MED ORDER — METHYLERGONOVINE MALEATE 0.2 MG/ML IJ SOLN: 0.2000 mg | INTRAMUSCULAR | Status: DC | PRN

## 2010-11-27 MED ORDER — BUTORPHANOL TARTRATE 2 MG/ML IJ SOLN: 1.0000 mg | INTRAMUSCULAR | Status: DC | PRN

## 2010-11-27 MED ORDER — LANOLIN HYDROUS EX OINT: TOPICAL_OINTMENT | CUTANEOUS | Status: DC | PRN

## 2010-11-27 MED ORDER — OXYCODONE-ACETAMINOPHEN 5-325 MG PO TABS
1.0000 | ORAL_TABLET | ORAL | Status: DC | PRN
  Administered 2010-11-28 (×2): 1 via ORAL
  Filled 2010-11-27 (×2): qty 1

## 2010-11-27 MED ORDER — LACTATED RINGERS IV SOLN: INTRAVENOUS | Status: DC

## 2010-11-27 MED ORDER — BENZOCAINE-MENTHOL 20-0.5 % EX AERO
1.0000 "application " | INHALATION_SPRAY | CUTANEOUS | Status: DC | PRN
  Administered 2010-11-27: 1 via TOPICAL

## 2010-11-27 MED ORDER — OXYCODONE-ACETAMINOPHEN 5-325 MG PO TABS
2.0000 | ORAL_TABLET | ORAL | Status: DC | PRN
  Administered 2010-11-27: 2 via ORAL
  Filled 2010-11-27: qty 2

## 2010-11-27 MED ORDER — ONDANSETRON HCL 4 MG/2ML IJ SOLN: 4.0000 mg | Freq: Four times a day (QID) | INTRAMUSCULAR | Status: DC | PRN

## 2010-11-27 MED ORDER — OXYTOCIN 10 UNIT/ML IJ SOLN
INTRAMUSCULAR | Status: AC
  Administered 2010-11-27: 10 [IU] via INTRAMUSCULAR
  Filled 2010-11-27: qty 2

## 2010-11-27 MED ORDER — LIDOCAINE HCL (PF) 1 % IJ SOLN
INTRAMUSCULAR | Status: AC
  Filled 2010-11-27: qty 30

## 2010-11-27 MED ORDER — ONDANSETRON HCL 4 MG/2ML IJ SOLN: 4.0000 mg | INTRAMUSCULAR | Status: DC | PRN

## 2010-11-27 MED ORDER — TETANUS-DIPHTH-ACELL PERTUSSIS 5-2.5-18.5 LF-MCG/0.5 IM SUSP
0.5000 mL | Freq: Once | INTRAMUSCULAR | Status: AC
  Administered 2010-11-29: 0.5 mL via INTRAMUSCULAR
  Filled 2010-11-27: qty 0.5

## 2010-11-27 MED ORDER — OXYTOCIN BOLUS FROM INFUSION
500.0000 mL | Freq: Once | INTRAVENOUS | Status: DC
  Filled 2010-11-27: qty 500

## 2010-11-27 MED ORDER — ONDANSETRON HCL 4 MG PO TABS: 4.0000 mg | ORAL_TABLET | ORAL | Status: DC | PRN

## 2010-11-27 MED ORDER — ACETAMINOPHEN 325 MG PO TABS: 650.0000 mg | ORAL_TABLET | ORAL | Status: DC | PRN

## 2010-11-27 MED ORDER — LIDOCAINE HCL (PF) 1 % IJ SOLN
30.0000 mL | INTRAMUSCULAR | Status: DC | PRN
  Filled 2010-11-27: qty 30

## 2010-11-27 MED ORDER — CITRIC ACID-SODIUM CITRATE 334-500 MG/5ML PO SOLN: 30.0000 mL | ORAL | Status: DC | PRN

## 2010-11-27 MED ORDER — ZOLPIDEM TARTRATE 5 MG PO TABS: 5.0000 mg | ORAL_TABLET | Freq: Every evening | ORAL | Status: DC | PRN

## 2010-11-27 MED ORDER — OXYTOCIN 20 UNITS IN LACTATED RINGERS INFUSION - SIMPLE: 125.0000 mL/h | Freq: Once | INTRAVENOUS | Status: DC

## 2010-11-27 MED ORDER — LACTATED RINGERS IV SOLN: 500.0000 mL | INTRAVENOUS | Status: DC | PRN

## 2010-11-27 MED ORDER — PRENATAL PLUS 27-1 MG PO TABS: 1.0000 | ORAL_TABLET | Freq: Every day | ORAL | Status: DC

## 2010-11-27 MED ORDER — IBUPROFEN 600 MG PO TABS
600.0000 mg | ORAL_TABLET | Freq: Four times a day (QID) | ORAL | Status: DC
  Administered 2010-11-27 – 2010-11-29 (×7): 600 mg via ORAL
  Filled 2010-11-27 (×7): qty 1

## 2010-11-27 MED ORDER — FLEET ENEMA 7-19 GM/118ML RE ENEM: 1.0000 | ENEMA | RECTAL | Status: DC | PRN

## 2010-11-27 NOTE — H&P (Signed)
Misty Gentry is a 25 y.o. female presenting for labor Presents to MAU with painful contractions. Cervix 8cm History OB History    Grav Para Term Preterm Abortions TAB SAB Ect Mult Living   4 3 3  1  1   3      Past Medical History  Diagnosis Date  . Personal history of kidney stones 2011  . Depression    Past Surgical History  Procedure Date  . Kidney stone surgery 12/2009   Family History: family history is negative for Anesthesia problems, and Hypotension, and Malignant hyperthermia, and Pseudochol deficiency, . Social History:  reports that she quit smoking about 4 years ago. Her smoking use included Cigarettes. She has a 8.5 pack-year smoking history. She does not have any smokeless tobacco history on file. She reports that she does not drink alcohol or use illicit drugs.  ROS  Dilation: 10 Effacement (%): 100 Station: +1 Exam by:: Luticia Tadros Blood pressure 135/59, pulse 94, temperature 97.1 F (36.2 C), temperature source Oral, resp. rate 18, height 5\' 2"  (1.575 m), weight 99.701 kg (219 lb 12.8 oz), last menstrual period 02/13/2010, unknown if currently breastfeeding. Exam Physical Exam  Prenatal labs: ABO, Rh: A/Positive/-- (03/21 0000) Antibody: Negative (03/21 0000) Rubella: Immune (03/21 0000) RPR: Nonreactive (03/21 0000)  HBsAg: Negative (03/21 0000)  HIV: Non-reactive (03/21 0000)  GBS: Negative (10/09 0000)   Assessment/Plan: Admit L&D Anticipate svd  Misty Gentry H. 11/27/2010, 6:24 PM

## 2010-11-27 NOTE — Progress Notes (Signed)
Contractions started last night 15-20 minutes apart, now contractions 5 to 6 minutes apart, G3P2

## 2010-11-28 LAB — CBC
Hemoglobin: 11.2 g/dL — ABNORMAL LOW (ref 12.0–15.0)
MCH: 30.8 pg (ref 26.0–34.0)
MCV: 90.9 fL (ref 78.0–100.0)
RBC: 3.64 MIL/uL — ABNORMAL LOW (ref 3.87–5.11)

## 2010-11-28 NOTE — Progress Notes (Signed)
Post Partum Day 1 Subjective: no complaints, up ad lib, voiding and tolerating PO  Objective: Blood pressure 111/72, pulse 85, temperature 98.5 F (36.9 C), temperature source Oral, resp. rate 18, height 5\' 2"  (1.575 m), weight 99.701 kg (219 lb 12.8 oz), last menstrual period 02/13/2010, unknown if currently breastfeeding.  Physical Exam:  General: alert, cooperative, appears stated age and no distress Lochia: appropriate Uterine Fundus: firm DVT Evaluation: No evidence of DVT seen on physical exam.   Basename 11/28/10 0514  HGB 11.2*  HCT 33.1*    Assessment/Plan: Routine postpartum care   LOS: 1 day   Ani Deoliveira H. 11/28/2010, 6:33 AM
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
## 2010-11-29 MED ORDER — IBUPROFEN 600 MG PO TABS: 600.0000 mg | ORAL_TABLET | Freq: Four times a day (QID) | ORAL | Status: AC | PRN

## 2010-11-29 MED ORDER — HYDROCODONE-ACETAMINOPHEN 5-500 MG PO TABS: 1.0000 | ORAL_TABLET | ORAL | Status: AC | PRN

## 2010-11-29 NOTE — Discharge Summary (Signed)
Obstetric Discharge Summary Reason for Admission: onset of labor Prenatal Procedures: ultrasound Intrapartum Procedures: spontaneous vaginal delivery Postpartum Procedures: none Complications-Operative and Postpartum: none Hemoglobin  Date Value Range Status  11/28/2010 11.2* 12.0-15.0 (g/dL) Final     HCT  Date Value Range Status  11/28/2010 33.1* 36.0-46.0 (%) Final    Discharge Diagnoses: Term Pregnancy-delivered  Discharge Information: Date: 11/29/2010 Activity: pelvic rest Diet: routine Medications: Ibuprofen and Vicodin Condition: stable Instructions: refer to practice specific booklet Discharge to: home Follow-up Information    Follow up with Almon Hercules.. (For post partum visit)    Contact information:   [address] [place] [address] [phone]          Newborn Data: Live born female  Birth Weight: 7 lb 11.3 oz (3496 g) APGAR: 9, 9  Home with mother.  Misty Tomczak H. 11/29/2010, 11:40 AM

## 2010-11-30 NOTE — Progress Notes (Signed)
Retro chart review per Medicaid guidelines.  

## 2011-03-04 ENCOUNTER — Inpatient Hospital Stay (HOSPITAL_COMMUNITY)
Admission: AD | Admit: 2011-03-04 | Discharge: 2011-03-05 | Disposition: A | Discharge status: Home / Self Care | Payer: Medicaid Other | Source: Ambulatory Visit | Attending: Obstetrics and Gynecology | Admitting: Obstetrics and Gynecology

## 2011-03-04 DIAGNOSIS — N760 Acute vaginitis: Secondary | ICD-10-CM | POA: Insufficient documentation

## 2011-03-04 DIAGNOSIS — B373 Candidiasis of vulva and vagina: Secondary | ICD-10-CM | POA: Insufficient documentation

## 2011-03-04 DIAGNOSIS — R109 Unspecified abdominal pain: Secondary | ICD-10-CM | POA: Insufficient documentation

## 2011-03-04 DIAGNOSIS — A499 Bacterial infection, unspecified: Secondary | ICD-10-CM | POA: Insufficient documentation

## 2011-03-04 DIAGNOSIS — B3731 Acute candidiasis of vulva and vagina: Secondary | ICD-10-CM | POA: Insufficient documentation

## 2011-03-04 DIAGNOSIS — B9689 Other specified bacterial agents as the cause of diseases classified elsewhere: Secondary | ICD-10-CM | POA: Insufficient documentation

## 2011-03-04 LAB — URINALYSIS, ROUTINE W REFLEX MICROSCOPIC
Bilirubin Urine: NEGATIVE
Glucose, UA: NEGATIVE mg/dL
Ketones, ur: NEGATIVE mg/dL
pH: 6 (ref 5.0–8.0)

## 2011-03-04 LAB — URINE MICROSCOPIC-ADD ON

## 2011-03-04 NOTE — Progress Notes (Signed)
Pt having RLQ and LLQ pain "feels like fallopian tubes are on fire."  LMP 01/24/2011  G4 P3.  Denies bleeding or discharge today.  Pt reports passing dark blood 1/14-1/21.

## 2011-03-05 ENCOUNTER — Encounter (HOSPITAL_COMMUNITY): Payer: Self-pay | Admitting: *Deleted

## 2011-03-05 LAB — WET PREP, GENITAL: Trich, Wet Prep: NONE SEEN

## 2011-03-05 LAB — CBC
HCT: 40.6 % (ref 36.0–46.0)
Hemoglobin: 13.8 g/dL (ref 12.0–15.0)
MCH: 29.2 pg (ref 26.0–34.0)
MCV: 86 fL (ref 78.0–100.0)
RBC: 4.72 MIL/uL (ref 3.87–5.11)

## 2011-03-05 LAB — COMPREHENSIVE METABOLIC PANEL
AST: 36 U/L (ref 0–37)
BUN: 16 mg/dL (ref 6–23)
CO2: 27 mEq/L (ref 19–32)
Chloride: 99 mEq/L (ref 96–112)
Creatinine, Ser: 0.63 mg/dL (ref 0.50–1.10)
GFR calc non Af Amer: >90 mL/min (ref >90)
Total Bilirubin: 0.2 mg/dL — ABNORMAL LOW (ref 0.3–1.2)

## 2011-03-05 LAB — HCG, SERUM, QUALITATIVE: Preg, Serum: NEGATIVE

## 2011-03-05 MED ORDER — FLUCONAZOLE 150 MG PO TABS: 150.0000 mg | ORAL_TABLET | Freq: Once | ORAL | Status: AC

## 2011-03-05 MED ORDER — METRONIDAZOLE 500 MG PO TABS: 500.0000 mg | ORAL_TABLET | Freq: Two times a day (BID) | ORAL | Status: AC

## 2011-03-05 NOTE — ED Provider Notes (Signed)
History     Chief Complaint  Patient presents with  . Abdominal Pain   HPI  Pt is possibly pregnant with faintly positive pregnancy tests 2 weeks ago.  She was on Ortho Evra- stopped when she thought she was pregnant started 1st patch January 1. She started bleeding Jan 14 for a couple of days and she stopped her patch at that time. She has constant aching/burning in her tubal area.  She last had intercourse 1/23 without.  She has had sore breasts and feeling nauseated but not vomiting.  She has not taken any medication.  She denies spotting or bleeding or vaginal discharge. She has a 64 month old.  Past Medical History  Diagnosis Date  . Personal history of kidney stones 2011  . Depression     Past Surgical History  Procedure Date  . Kidney stone surgery 12/2009    Family History  Problem Relation Age of Onset  . Anesthesia problems Neg Hx   . Hypotension Neg Hx   . Malignant hyperthermia Neg Hx   . Pseudochol deficiency Neg Hx     History  Substance Use Topics  . Smoking status: Former Smoker -- 1.0 packs/day for 8.5 years    Types: Cigarettes    Quit date: 10/31/2006  . Smokeless tobacco: Never Used  . Alcohol Use: No     stopped drinking in 2008    Allergies: No Known Allergies  No prescriptions prior to admission    ROS Physical Exam   Blood pressure 124/94, pulse 97, temperature 98.1 F (36.7 C), temperature source Oral, resp. rate 18, height 5\' 2"  (1.575 m), weight 194 lb 9.6 oz (88.27 kg), last menstrual period 01/24/2011, SpO2 99.00%.  Physical Exam  Nursing note and vitals reviewed. Constitutional: She is oriented to person, place, and time. She appears well-developed and well-nourished. No distress.  HENT:  Head: Normocephalic.  Eyes: Pupils are equal, round, and reactive to light.  Neck: Normal range of motion. Neck supple.  Cardiovascular: Normal rate.   Respiratory: Effort normal.  GI: Soft. She exhibits no distension. There is no tenderness.  There is no rebound.  Genitourinary:       Mod amount of frothy white discharge in vault; cervix tender with Q-tip but no CMT; uterus NSSC nontender adnexa without palpable enlargement or tenderness  Musculoskeletal: Normal range of motion.  Neurological: She is alert and oriented to person, place, and time.  Skin: Skin is warm and dry.  Psychiatric: She has a normal mood and affect.    MAU Course  Procedures Urine pregnancy test and Serum Qualitative HCG were negative Pt desires tubal ligation Results for orders placed during the hospital encounter of 03/04/11 (from the past 24 hour(s))  URINALYSIS, ROUTINE W REFLEX MICROSCOPIC     Status: Abnormal   Collection Time   03/04/11 10:38 PM      Component Value Range   Color, Urine YELLOW  YELLOW    APPearance CLEAR  CLEAR    Specific Gravity, Urine >1.030 (*) 1.005 - 1.030    pH 6.0  5.0 - 8.0    Glucose, UA NEGATIVE  NEGATIVE (mg/dL)   Hgb urine dipstick LARGE (*) NEGATIVE    Bilirubin Urine NEGATIVE  NEGATIVE    Ketones, ur NEGATIVE  NEGATIVE (mg/dL)   Protein, ur NEGATIVE  NEGATIVE (mg/dL)   Urobilinogen, UA 0.2  0.0 - 1.0 (mg/dL)   Nitrite NEGATIVE  NEGATIVE    Leukocytes, UA NEGATIVE  NEGATIVE   URINE MICROSCOPIC-ADD  ON     Status: Abnormal   Collection Time   03/04/11 10:38 PM      Component Value Range   Squamous Epithelial / LPF RARE  RARE    WBC, UA 3-6  <3 (WBC/hpf)   RBC / HPF TOO NUMEROUS TO COUNT  <3 (RBC/hpf)   Bacteria, UA FEW (*) RARE   POCT PREGNANCY, URINE     Status: Normal   Collection Time   03/04/11 10:45 PM      Component Value Range   Preg Test, Ur NEGATIVE  NEGATIVE   CBC     Status: Abnormal   Collection Time   03/05/11  1:10 AM      Component Value Range   WBC 10.9 (*) 4.0 - 10.5 (K/uL)   RBC 4.72  3.87 - 5.11 (MIL/uL)   Hemoglobin 13.8  12.0 - 15.0 (g/dL)   HCT 81.1  91.4 - 78.2 (%)   MCV 86.0  78.0 - 100.0 (fL)   MCH 29.2  26.0 - 34.0 (pg)   MCHC 34.0  30.0 - 36.0 (g/dL)   RDW 95.6  21.3 -  08.6 (%)   Platelets 293  150 - 400 (K/uL)  HCG, SERUM, QUALITATIVE     Status: Normal   Collection Time   03/05/11  1:10 AM      Component Value Range   Preg, Serum NEGATIVE  NEGATIVE   COMPREHENSIVE METABOLIC PANEL     Status: Abnormal   Collection Time   03/05/11  1:10 AM      Component Value Range   Sodium 135  135 - 145 (mEq/L)   Potassium 3.9  3.5 - 5.1 (mEq/L)   Chloride 99  96 - 112 (mEq/L)   CO2 27  19 - 32 (mEq/L)   Glucose, Bld 86  70 - 99 (mg/dL)   BUN 16  6 - 23 (mg/dL)   Creatinine, Ser 5.78  0.50 - 1.10 (mg/dL)   Calcium 9.9  8.4 - 46.9 (mg/dL)   Total Protein 7.8  6.0 - 8.3 (g/dL)   Albumin 3.6  3.5 - 5.2 (g/dL)   AST 36  0 - 37 (U/L)   ALT 50 (*) 0 - 35 (U/L)   Alkaline Phosphatase 104  39 - 117 (U/L)   Total Bilirubin 0.2 (*) 0.3 - 1.2 (mg/dL)   GFR calc non Af Amer >90  >90 (mL/min)   GFR calc Af Amer >90  >90 (mL/min)  WET PREP, GENITAL     Status: Abnormal   Collection Time   03/05/11  2:10 AM      Component Value Range   Yeast Wet Prep HPF POC FEW (*) NONE SEEN    Trich, Wet Prep NONE SEEN  NONE SEEN    Clue Cells Wet Prep HPF POC FEW (*) NONE SEEN    WBC, Wet Prep HPF POC MANY (*) NONE SEEN      Assessment and Plan  Desiring tubal ligation- will have GYN clinic contact pt Yeast vaginitis- prescription for Diflucan 150mg  #2 one PO now and repeat 72 hours if symptoms persist for yeast Bacterial vaginosis- prescription for Flagyl 500mg  BID for 7 days Misty Gentry 03/05/2011, 1:41 AM

## 2011-03-05 NOTE — ED Provider Notes (Signed)
Agree with above note.  Kennidee Heyne 03/05/2011 5:57 AM

## 2011-03-06 LAB — GC/CHLAMYDIA PROBE AMP, GENITAL
Chlamydia, DNA Probe: NEGATIVE
GC Probe Amp, Genital: NEGATIVE

## 2011-04-02 ENCOUNTER — Encounter (HOSPITAL_COMMUNITY): Payer: Self-pay

## 2011-04-13 ENCOUNTER — Inpatient Hospital Stay (HOSPITAL_COMMUNITY)
Admission: AD | Admit: 2011-04-13 | Discharge: 2011-04-13 | Disposition: A | Discharge status: Home / Self Care | Payer: Medicaid Other | Source: Ambulatory Visit | Attending: Family Medicine | Admitting: Family Medicine

## 2011-04-13 ENCOUNTER — Encounter (HOSPITAL_COMMUNITY): Payer: Self-pay | Admitting: *Deleted

## 2011-04-13 DIAGNOSIS — N946 Dysmenorrhea, unspecified: Secondary | ICD-10-CM | POA: Insufficient documentation

## 2011-04-13 LAB — CBC
HCT: 36 % (ref 36.0–46.0)
Hemoglobin: 12.3 g/dL (ref 12.0–15.0)
MCHC: 34.2 g/dL (ref 30.0–36.0)
RBC: 4.16 MIL/uL (ref 3.87–5.11)
WBC: 9.6 10*3/uL (ref 4.0–10.5)

## 2011-04-13 LAB — DIFFERENTIAL
Basophils Relative: 0 % (ref 0–1)
Lymphocytes Relative: 32 % (ref 12–46)
Lymphs Abs: 3.1 10*3/uL (ref 0.7–4.0)
Monocytes Relative: 6 % (ref 3–12)
Neutro Abs: 5.7 10*3/uL (ref 1.7–7.7)
Neutrophils Relative %: 59 % (ref 43–77)

## 2011-04-13 LAB — WET PREP, GENITAL: Clue Cells Wet Prep HPF POC: NONE SEEN

## 2011-04-13 NOTE — MAU Provider Note (Signed)
History     CSN: [id number]  Arrival date & time 04/13/11  1903   None     No chief complaint on file.   HPI Misty Gentry is a 26 y.o. female who presents to MAU for vaginal bleeding that started 3 days ago. She feels that she is having a miscarriage. LMP 02/15/11. Was here in February for abdominal pain and had a negative pregnancy test at that time. No birth control. States she has headaches and sometimes when she drives has blurry vision and feels anxious. This has been going on for several months. The history was provided by the patient.  Past Medical History  Diagnosis Date  . Personal history of kidney stones 2011  . Depression     Past Surgical History  Procedure Date  . Kidney stone surgery 12/2009    Family History  Problem Relation Age of Onset  . Anesthesia problems Neg Hx   . Hypotension Neg Hx   . Malignant hyperthermia Neg Hx   . Pseudochol deficiency Neg Hx     History  Substance Use Topics  . Smoking status: Former Smoker -- 1.0 packs/day for 8.5 years    Types: Cigarettes    Quit date: 10/31/2006  . Smokeless tobacco: Never Used  . Alcohol Use: No     stopped drinking in 2008    OB History    Grav Para Term Preterm Abortions TAB SAB Ect Mult Living   4 3 3  1  1   3       Review of Systems  Constitutional: Positive for appetite change. Negative for fever, chills, diaphoresis and fatigue.  HENT: Negative for ear pain, congestion, sore throat, facial swelling, neck pain, neck stiffness, dental problem and sinus pressure.   Eyes: Positive for visual disturbance. Negative for photophobia, pain and discharge.  Respiratory: Negative for cough, chest tightness and wheezing.   Cardiovascular:       SVT (no medication)  Gastrointestinal: Positive for nausea, abdominal pain (cramping) and diarrhea. Negative for vomiting, constipation and abdominal distention.  Genitourinary: Positive for frequency, vaginal bleeding and pelvic pain. Negative for dysuria,  flank pain, vaginal discharge and difficulty urinating.  Musculoskeletal: Positive for back pain. Negative for myalgias and gait problem.  Skin: Negative for color change and rash.  Neurological: Positive for dizziness and headaches. Negative for speech difficulty, weakness, light-headedness and numbness.  Psychiatric/Behavioral: Negative for confusion and agitation. The patient is nervous/anxious.     Allergies  Review of patient's allergies indicates no known allergies.  Home Medications  No current outpatient prescriptions on file.  BP 123/67  Pulse 90  Temp(Src) 98.5 F (36.9 C) (Oral)  Ht 5\' 2"  (1.575 m)  Wt 196 lb 6 oz (89.075 kg)  BMI 35.92 kg/m2  LMP 02/15/2011  Physical Exam  Nursing note and vitals reviewed. Constitutional: She is oriented to person, place, and time. She appears well-developed and well-nourished. No distress.  HENT:  Head: Normocephalic.  Eyes: EOM are normal.  Neck: Neck supple.  Cardiovascular: Normal rate.   Pulmonary/Chest: Effort normal.  Abdominal: Soft. There is no tenderness.  Genitourinary:       External genitalia without lesions, brown discharge vaginal vault, no active bleeding noted. Cervix closed, no cervical motion tenderness, mildly tender bilateral adnexa. Uterus without palpable enlargement.   Musculoskeletal: Normal range of motion.  Neurological: She is alert and oriented to person, place, and time. No cranial nerve deficit.  Skin: Skin is warm and dry.  Psychiatric: She  has a normal mood and affect. Her behavior is normal. Judgment and thought content normal.   Results for orders placed during the hospital encounter of 04/13/11 (from the past 24 hour(s))  POCT PREGNANCY, URINE     Status: Normal   Collection Time   04/13/11  7:48 PM      Component Value Range   Preg Test, Ur NEGATIVE  NEGATIVE   WET PREP, GENITAL     Status: Abnormal   Collection Time   04/13/11  7:52 PM      Component Value Range   Yeast Wet Prep HPF POC  NONE SEEN  NONE SEEN    Trich, Wet Prep NONE SEEN  NONE SEEN    Clue Cells Wet Prep HPF POC NONE SEEN  NONE SEEN    WBC, Wet Prep HPF POC FEW (*) NONE SEEN   CBC     Status: Normal   Collection Time   04/13/11  7:58 PM      Component Value Range   WBC 9.6  4.0 - 10.5 (K/uL)   RBC 4.16  3.87 - 5.11 (MIL/uL)   Hemoglobin 12.3  12.0 - 15.0 (g/dL)   HCT 16.1  09.6 - 04.5 (%)   MCV 86.5  78.0 - 100.0 (fL)   MCH 29.6  26.0 - 34.0 (pg)   MCHC 34.2  30.0 - 36.0 (g/dL)   RDW 40.9  81.1 - 91.4 (%)   Platelets 279  150 - 400 (K/uL)  DIFFERENTIAL     Status: Normal   Collection Time   04/13/11  7:58 PM      Component Value Range   Neutrophils Relative 59  43 - 77 (%)   Neutro Abs 5.7  1.7 - 7.7 (K/uL)   Lymphocytes Relative 32  12 - 46 (%)   Lymphs Abs 3.1  0.7 - 4.0 (K/uL)   Monocytes Relative 6  3 - 12 (%)   Monocytes Absolute 0.6  0.1 - 1.0 (K/uL)   Eosinophils Relative 2  0 - 5 (%)   Eosinophils Absolute 0.2  0.0 - 0.7 (K/uL)   Basophils Relative 0  0 - 1 (%)   Basophils Absolute 0.0  0.0 - 0.1 (K/uL)    Assessment: Dysmenorrhea    Plan:  Discussed with patient need for follow up with a regular GYN rather than using ED's   Discussed need for follow up for vision check and not to drive until she sees an eye doctor   Advil as needed for cramping ED Course  Procedures    MDM

## 2011-04-13 NOTE — Discharge Instructions (Signed)
YOUR BLOOD WORK TODAY SHOWS THAT YOU ARE NOT ANEMIC. YOUR PREGNANCY TEST IS NEGATIVE. THE BLEEDING YOU ARE HAVING IS YOUR PERIOD.  IT IS IMPORTANT THAT YOU BECOME ESTABLISHED WITH A REGULAR DOCTOR WHO CAN FOLLOW YOUR CARE AND BECOME FAMILIAR WITH YOU AND YOUR MEDICAL RECORD. CALL THE GYN CLINIC TO SCHEDULE A FOLLOW UP APPOINTMENT.  IT IS IMPORTANT THAT YOU SEE AN EYE DOCTOR FOR YOUR BLURRY VISION. DO NO DRIVE UNTIL YOU HAVE FOLLOW UP.  Dysmenorrhea Menstrual pain is caused by the muscles of the uterus tightening (contracting) during a menstrual period. The muscles of the uterus contract due to the chemicals in the uterine lining. Primary dysmenorrhea is menstrual cramps that last a couple of days when you start having menstrual periods or soon after. This often begins after a teenager starts having her period. As a woman gets older or has a baby, the cramps will usually lesson or disappear. Secondary dysmenorrhea begins later in life, lasts longer, and the pain may be stronger than primary dysmenorrhea. The pain may start before the period and last a few days after the period. This type of dysmenorrhea is usually caused by an underlying problem such as:  The tissue lining the uterus grows outside of the uterus in other areas of the body (endometriosis).   The endometrial tissue, which normally lines the uterus, is found in or grows into the muscular walls of the uterus (adenomyosis).   The pelvic blood vessels are engorged with blood just before the menstrual period (pelvic congestive syndrome).   Overgrowth of cells in the lining of the uterus or cervix (polyps of the uterus or cervix).   Falling down of the uterus (prolapse) because of loose or stretched ligaments.   Depression.   Bladder problems, infection, or inflammation.   Problems with the intestine, a tumor, or irritable bowel syndrome.   Cancer of the female organs or bladder.   A severely tipped uterus.   A very tight opening  or closed cervix.   Noncancerous tumors of the uterus (fibroids).   Pelvic inflammatory disease (PID).   Pelvic scarring (adhesions) from a previous surgery.   Ovarian cyst.   An intrauterine device (IUD) used for birth control.  CAUSES  The cause of menstrual pain is often unknown. SYMPTOMS   Cramping or throbbing pain in your lower abdomen.   Sometimes, a woman may also experience headaches.   Lower back pain.   Feeling sick to your stomach (nausea) or vomiting.   Diarrhea.   Sweating or dizziness.  DIAGNOSIS  A diagnosis is based on your history, symptoms, physical examination, diagnostic tests, or procedures. Diagnostic tests or procedures may include:  Blood tests.   An ultrasound.   An examination of the lining of the uterus (dilation and curettage, D&C).   An examination inside your abdomen or pelvis with a scope (laparoscopy).   X-rays.   CT Scan.   MRI.   An examination inside the bladder with a scope (cystoscopy).   An examination inside the intestine or stomach with a scope (colonoscopy, gastroscopy).  TREATMENT  Treatment depends on the cause of the dysmenorrhea. Treatment may include:  Pain medicine prescribed by your caregiver.   Birth control pills.   Hormone replacement therapy.   Nonsteroidal anti-inflammatory drugs (NSAIDs). These may help stop the production of prostaglandins.   An IUD with progesterone hormone in it.   Acupuncture.   Surgery to remove adhesions, endometriosis, ovarian cyst, or fibroids.   Removal of the uterus (  hysterectomy).   Progesterone shots to stop the menstrual period.   Cutting the nerves on the sacrum that go to the female organs (presacral neurectomy).   Electric currant to the sacral nerves (sacral nerve stimulation).   Antidepressant medicine.   Psychiatric therapy, counseling, or group therapy.   Exercise and physical therapy.   Meditation and yoga therapy.  HOME CARE INSTRUCTIONS   Only  take over-the-counter or prescription medicines for pain, discomfort, or fever as directed by your caregiver.   Place a heating pad or hot water bottle on your lower back or abdomen. Do not sleep with the heating pad.   Use aerobic exercises, walking, swimming, biking, and other exercises to help lessen the cramping.   Massage to the lower back or abdomen may help.   Stop smoking.   Avoid alcohol and caffeine.   Yoga, meditation, or acupuncture may help.  SEEK MEDICAL CARE IF:   The pain does not get better with medicine.   You have pain with sexual intercourse.  SEEK IMMEDIATE MEDICAL CARE IF:   Your pain increases and is not controlled with medicines.   You have a fever.   You develop nausea or vomiting with your period not controlled with medicine.   You have abnormal vaginal bleeding with your period.   You pass out.  MAKE SURE YOU:   Understand these instructions.   Will watch your condition.   Will get help right away if you are not doing well or get worse.  Document Released: 01/18/2005 Document Revised: 01/07/2011 Document Reviewed: 05/06/2008 [facility] Patient Information 2012 [place], [place].

## 2011-04-13 NOTE — MAU Note (Signed)
PT SAYS  THAT  SHE STARTED HAVING  PINK D/C ON Sunday AM  THEN TURNED  BLACK TARY VAG D/C ON  Sunday.  STILL HAS BLACK D/C  NOW- HAS PAD ON.     ABD CRAMPS  IN LOWER ABD - AROUND TO [facility]- STARTED ON Sunday.  DID HAVE PREG S/S- HOME PREG TEST  IN JAN AND FEB- ALL NEG.     AT END OF FEB - ALL PREG SIGNS WENT AWAY.     WAS ON  ORTHO- EVER BIRTH CONTROL PATCH-  TOOK OFF 02-03-2011 .   NOT HAVING SEX- NO BIRTH CONTROL NOW. LAST SEX WAS-   02-24-2011.

## 2011-04-14 NOTE — MAU Provider Note (Signed)
Attestation of Attending Supervision of Advanced Practitioner: Evaluation and management procedures were performed by the PA/NP/CNM/OB Fellow under my supervision/collaboration. Chart reviewed, and agree with management and plan.  Jaynie Collins, M.D. 04/14/2011 1:09 PM

## 2011-04-19 ENCOUNTER — Encounter (HOSPITAL_COMMUNITY): Admission: RE | Payer: Self-pay | Source: Ambulatory Visit

## 2011-04-19 ENCOUNTER — Ambulatory Visit (HOSPITAL_COMMUNITY)
Admission: RE | Admit: 2011-04-19 | Payer: Medicaid Other | Source: Ambulatory Visit | Admitting: Obstetrics and Gynecology

## 2011-04-19 SURGERY — LIGATION, FALLOPIAN TUBE, LAPAROSCOPIC
Anesthesia: Choice | Laterality: Bilateral

## 2011-05-08 ENCOUNTER — Encounter (HOSPITAL_COMMUNITY): Payer: Self-pay

## 2011-05-08 ENCOUNTER — Inpatient Hospital Stay (HOSPITAL_COMMUNITY)
Admission: AD | Admit: 2011-05-08 | Discharge: 2011-05-08 | Disposition: A | Discharge status: Home / Self Care | Payer: Medicaid Other | Source: Ambulatory Visit | Attending: Obstetrics & Gynecology | Admitting: Obstetrics & Gynecology

## 2011-05-08 DIAGNOSIS — N644 Mastodynia: Secondary | ICD-10-CM | POA: Insufficient documentation

## 2011-05-08 DIAGNOSIS — N912 Amenorrhea, unspecified: Secondary | ICD-10-CM | POA: Insufficient documentation

## 2011-05-08 DIAGNOSIS — R51 Headache: Secondary | ICD-10-CM | POA: Insufficient documentation

## 2011-05-08 LAB — URINALYSIS, ROUTINE W REFLEX MICROSCOPIC
Specific Gravity, Urine: 1.025 (ref 1.005–1.030)
Urobilinogen, UA: 0.2 mg/dL (ref 0.0–1.0)
pH: 6.5 (ref 5.0–8.0)

## 2011-05-08 LAB — URINE MICROSCOPIC-ADD ON

## 2011-05-08 LAB — POCT PREGNANCY, URINE: Preg Test, Ur: NEGATIVE

## 2011-05-08 NOTE — MAU Note (Signed)
Pt states, " I've had sharp burning pain in my left breast for 3 days, and today my right breast has started hurting. I was supposed to start my period on the 3rd but didn't."

## 2011-05-08 NOTE — MAU Note (Signed)
Patient is here with concern that she may be pregnant, she states that her period is 3 days late, she is having constant left breast aching pain, intermittent right breast burning, headache and constant nausea. She denies any vaginal bleeding, or discharge. She has a 5months old infant that is bottle fed

## 2011-11-03 ENCOUNTER — Encounter (HOSPITAL_COMMUNITY): Payer: Self-pay

## 2011-11-03 ENCOUNTER — Inpatient Hospital Stay (HOSPITAL_COMMUNITY)
Admission: AD | Admit: 2011-11-03 | Discharge: 2011-11-04 | Disposition: A | Discharge status: Home / Self Care | Payer: Medicaid Other | Source: Ambulatory Visit | Attending: Obstetrics & Gynecology | Admitting: Obstetrics & Gynecology

## 2011-11-03 DIAGNOSIS — B9689 Other specified bacterial agents as the cause of diseases classified elsewhere: Secondary | ICD-10-CM | POA: Insufficient documentation

## 2011-11-03 DIAGNOSIS — R109 Unspecified abdominal pain: Secondary | ICD-10-CM

## 2011-11-03 DIAGNOSIS — A499 Bacterial infection, unspecified: Secondary | ICD-10-CM | POA: Insufficient documentation

## 2011-11-03 DIAGNOSIS — N760 Acute vaginitis: Secondary | ICD-10-CM | POA: Insufficient documentation

## 2011-11-03 DIAGNOSIS — O2390 Unspecified genitourinary tract infection in pregnancy, unspecified trimester: Secondary | ICD-10-CM | POA: Insufficient documentation

## 2011-11-03 LAB — URINE MICROSCOPIC-ADD ON

## 2011-11-03 LAB — URINALYSIS, ROUTINE W REFLEX MICROSCOPIC
Bilirubin Urine: NEGATIVE
Glucose, UA: NEGATIVE mg/dL
Ketones, ur: NEGATIVE mg/dL
pH: 6 (ref 5.0–8.0)

## 2011-11-03 NOTE — MAU Note (Signed)
Pt reports LMP 09/08/2011, positive home preg test. Pt states she has a "hot feeling" in lower abd. States it feels like someone placed a hot pan on her lower abd. Lower abd cramping x 3 weeks. Denies bleeding.

## 2011-11-03 NOTE — MAU Provider Note (Signed)
Chief Complaint: Abdominal Pain   Initial contact at 2354    SUBJECTIVE HPI: Misty Gentry is a 26 y.o. [phone] at [redacted]w[redacted]d by LMP who presents with crampy lower abdominal pain occurring randomly over the last 2 weeks; feels like menstrual cramps and located in suprapubic region. No vaginal bleeding. No irritative vaginal discharge.  A positive.  Past Medical History  Diagnosis Date  . Personal history of kidney stones 2011  . Depression    OB History    Grav Para Term Preterm Abortions TAB SAB Ect Mult Living   5 3 3  1  1   3      # Outc Date GA Lbr Len/2nd Wgt Sex Del Anes PTL Lv   1 TRM 10/12 [redacted]w[redacted]d 13:40 / 00:02 7lb11.3oz(3.496kg) F SVD None  Yes   2 TRM  [redacted]w[redacted]d 18:00 8lb(3.629kg) F VAC EPI  Yes   3 TRM  [redacted]w[redacted]d  8lb7oz(3.827kg) F SVD EPI  Yes   4 SAB            5 CUR              Past Surgical History  Procedure Date  . Kidney stone surgery 12/2009   History   Social History  . Marital Status: Married    Spouse Name: N/A    Number of Children: N/A  . Years of Education: N/A   Occupational History  . Not on file.   Social History Main Topics  . Smoking status: Former Smoker -- 1.0 packs/day for 8.5 years    Types: Cigarettes    Quit date: 10/31/2006  . Smokeless tobacco: Never Used  . Alcohol Use: No     stopped drinking in 2008  . Drug Use: No     Pt has H/O drug abuse (stopped in 2008); "Spanish Crystalmeth"  . Sexually Active: Yes    Birth Control/ Protection: None   Other Topics Concern  . Not on file   Social History Narrative  . No narrative on file   No current facility-administered medications on file prior to encounter.   Current Outpatient Prescriptions on File Prior to Encounter  Medication Sig Dispense Refill  . Prenatal Vit-Fe Fumarate-FA (PRENATAL MULTIVITAMIN) TABS Take 1 tablet by mouth at bedtime.       No Known Allergies  ROS: Pertinent items in HPI  OBJECTIVE Blood pressure 111/49, pulse 92, temperature 98.3 F (36.8 C), temperature  source Oral, resp. rate 18, height 5\' 2"  (1.575 m), weight 92.08 kg (203 lb), last menstrual period 09/08/2011, SpO2 100.00%, unknown if currently breastfeeding. GENERAL: Well-developed, well-nourished female in no acute distress.  HEENT: Normocephalic HEART: normal rate RESP: normal effort ABDOMEN: Soft, non-tender EXTREMITIES: Nontender, no edema NEURO: Alert and oriented SPECULUM EXAM: NEFG, thin discharge, no blood noted, cervix clean BIMANUAL: cervix multiparous, long, closed; uterus retroverted, no adnexal tenderness or masses  LAB RESULTS Results for orders placed during the hospital encounter of 11/03/11 (from the past 24 hour(s))  URINALYSIS, ROUTINE W REFLEX MICROSCOPIC     Status: Abnormal   Collection Time   11/03/11  9:56 PM      Component Value Range   Color, Urine YELLOW  YELLOW   APPearance CLEAR  CLEAR   Specific Gravity, Urine >1.030 (*) 1.005 - 1.030   pH 6.0  5.0 - 8.0   Glucose, UA NEGATIVE  NEGATIVE mg/dL   Hgb urine dipstick LARGE (*) NEGATIVE   Bilirubin Urine NEGATIVE  NEGATIVE   Ketones, ur NEGATIVE  NEGATIVE mg/dL   Protein, ur 30 (*) NEGATIVE mg/dL   Urobilinogen, UA 0.2  0.0 - 1.0 mg/dL   Nitrite NEGATIVE  NEGATIVE   Leukocytes, UA NEGATIVE  NEGATIVE  URINE MICROSCOPIC-ADD ON     Status: Abnormal   Collection Time   11/03/11  9:56 PM      Component Value Range   Squamous Epithelial / LPF FEW (*) RARE   WBC, UA 3-6  <3 WBC/hpf   RBC / HPF 11-20  <3 RBC/hpf   Bacteria, UA FEW (*) RARE  POCT PREGNANCY, URINE     Status: Abnormal   Collection Time   11/03/11 10:04 PM      Component Value Range   Preg Test, Ur POSITIVE (*) NEGATIVE  CBC     Status: Abnormal   Collection Time   11/03/11 11:59 PM      Component Value Range   WBC 11.9 (*) 4.0 - 10.5 K/uL   RBC 3.97  3.87 - 5.11 MIL/uL   Hemoglobin 11.9 (*) 12.0 - 15.0 g/dL   HCT 65.7 (*) 84.6 - 96.2 %   MCV 86.9  78.0 - 100.0 fL   MCH 30.0  26.0 - 34.0 pg   MCHC 34.5  30.0 - 36.0 g/dL   RDW 95.2   84.1 - 32.4 %   Platelets 254  150 - 400 K/uL  HCG, QUANTITATIVE, PREGNANCY     Status: Abnormal   Collection Time   11/03/11 11:59 PM      Component Value Range   hCG, Beta Chain, Quant, S 30083 (*) <5 mIU/mL  WET PREP, GENITAL     Status: Abnormal   Collection Time   11/04/11  1:40 AM      Component Value Range   Yeast Wet Prep HPF POC NONE SEEN  NONE SEEN   Trich, Wet Prep NONE SEEN  NONE SEEN   Clue Cells Wet Prep HPF POC MODERATE (*) NONE SEEN   WBC, Wet Prep HPF POC FEW (*) NONE SEEN    IMAGING No results found. *RADIOLOGY REPORT*  Clinical Data: Pelvic pain  OBSTETRIC <14 WK [place] AND TRANSVAGINAL OB US  Technique: Both transabdominal and transvaginal ultrasound  examinations were performed for complete evaluation of the  gestation as well as the maternal uterus, adnexal regions, and  pelvic cul-de-sac. Transvaginal technique was performed to assess  early pregnancy.  Comparison: None.  Intrauterine gestational sac: Visualized/normal in shape.  Yolk sac: Not identified  Embryo: Identified  Cardiac Activity: Identified  Heart Rate: 99 bpm  CRL: 3.5 mm 6 w 0 d [place] EDC: 06/29/2012  Maternal uterus/adnexae:  Ovaries grossly within normal limits. Only seen transabdominally.  No free fluid identified.  IMPRESSION:  Single intrauterine gestation with cardiac activity documented.  Estimated age of 6 weeks 0 days by crown-rump length.  Original Report Authenticated By: Waneta Martins, M.D.    ASSESSMENT 1. Abdominal pain complicating pregnancy   2. BV (bacterial vaginosis)   [id number] at [redacted]w[redacted]d  PLAN Discharge home with AVS pregnancy precautions, care providers, pregnancy verification letter GC/ CT sent   Medication List     As of 11/04/2011  2:16 AM    TAKE these medications         metroNIDAZOLE 500 MG tablet   Commonly known as: FLAGYL   Take 1 tablet (500 mg total) by mouth every 12 (twelve) hours.      prenatal multivitamin Tabs   Take 1 tablet by mouth at  bedtime.  Danae Orleans, CNM 11/03/2011  11:56 PM

## 2011-11-04 ENCOUNTER — Inpatient Hospital Stay (HOSPITAL_COMMUNITY): Payer: Medicaid Other

## 2011-11-04 ENCOUNTER — Encounter (HOSPITAL_COMMUNITY): Payer: Self-pay

## 2011-11-04 DIAGNOSIS — R109 Unspecified abdominal pain: Secondary | ICD-10-CM

## 2011-11-04 DIAGNOSIS — O26899 Other specified pregnancy related conditions, unspecified trimester: Secondary | ICD-10-CM

## 2011-11-04 LAB — CBC
Platelets: 254 10*3/uL (ref 150–400)
RBC: 3.97 MIL/uL (ref 3.87–5.11)
WBC: 11.9 10*3/uL — ABNORMAL HIGH (ref 4.0–10.5)

## 2011-11-04 LAB — WET PREP, GENITAL: Yeast Wet Prep HPF POC: NONE SEEN

## 2011-11-04 LAB — HCG, QUANTITATIVE, PREGNANCY: hCG, Beta Chain, Quant, S: 30083 m[IU]/mL — ABNORMAL HIGH (ref <5)

## 2011-11-04 MED ORDER — METRONIDAZOLE 500 MG PO TABS: 500.0000 mg | ORAL_TABLET | Freq: Two times a day (BID) | ORAL | Status: DC

## 2011-11-04 MED ORDER — METRONIDAZOLE 500 MG PO TABS: 500.0000 mg | ORAL_TABLET | Freq: Once | ORAL | Status: DC

## 2011-11-05 LAB — GC/CHLAMYDIA PROBE AMP, GENITAL
Chlamydia, DNA Probe: NEGATIVE
GC Probe Amp, Genital: NEGATIVE

## 2012-02-25 IMAGING — US US OB LIMITED
1 series · 13 of 28 positions shown · non-contrast
Comparison: none

[Series 1: us fetal bpp w/o nonstress · non-contrast · 33 acquisitions, 13 frames shown]
[im 2/33]
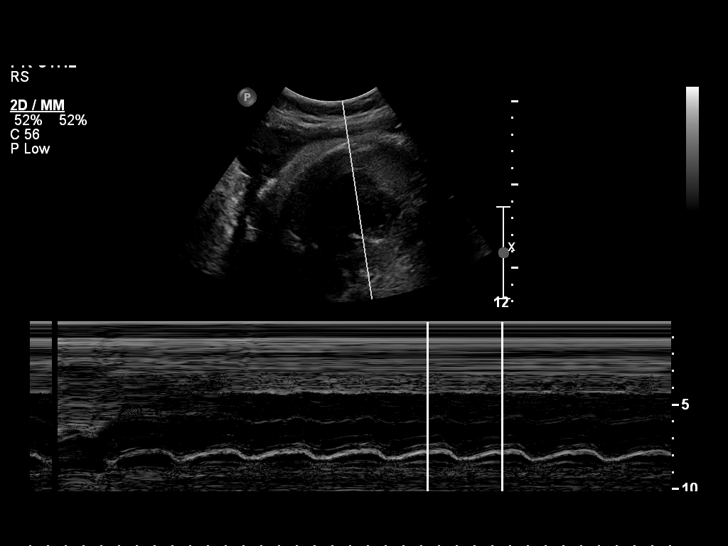
[im 4/33]
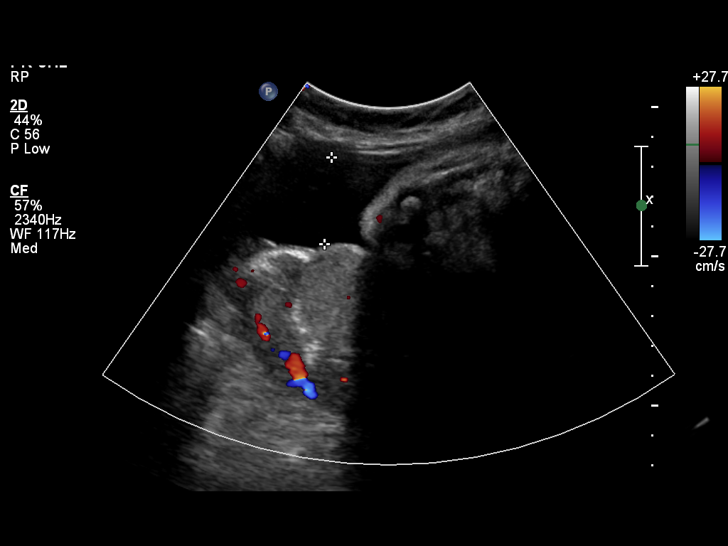
[im 6/33]
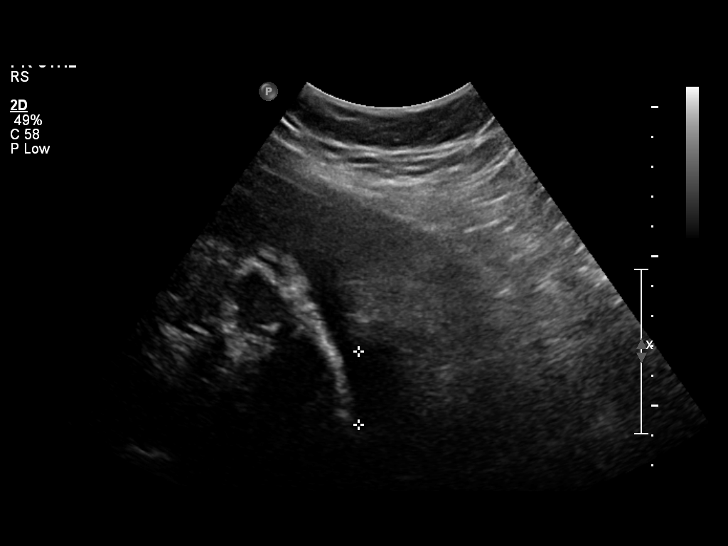
[im 9/33]
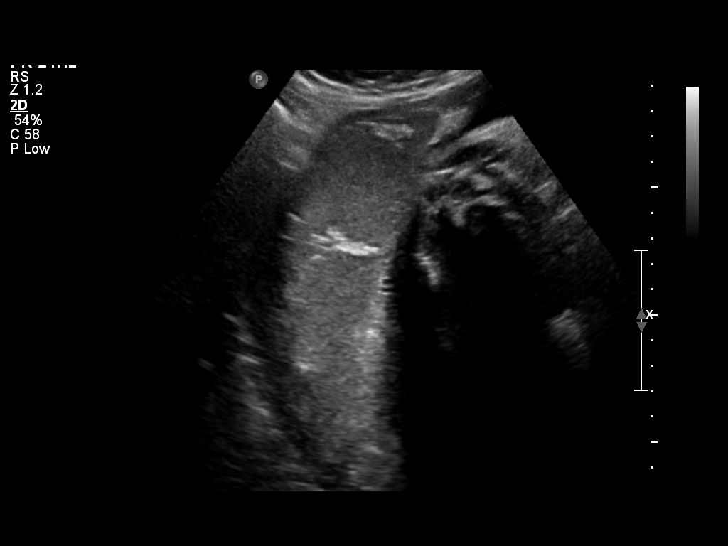
[im 11/33]
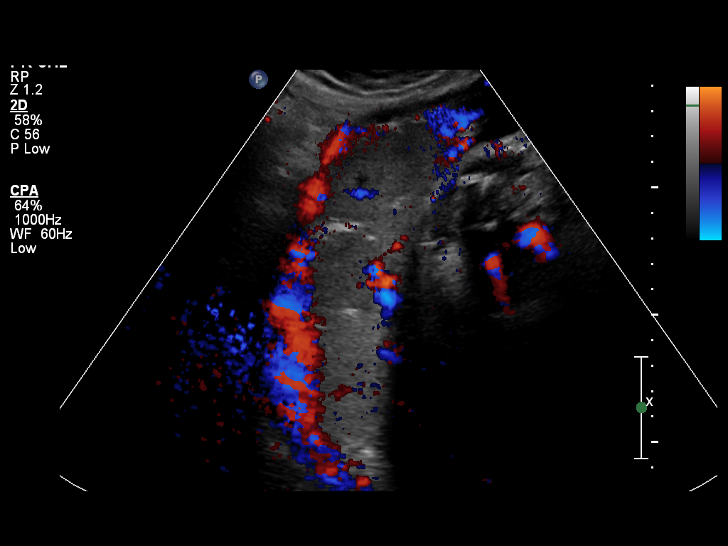
[im 14/33]
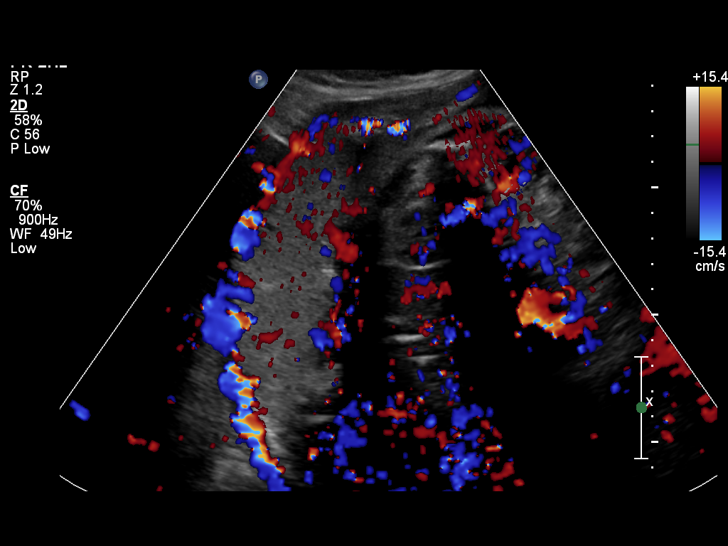
[im 17/33]
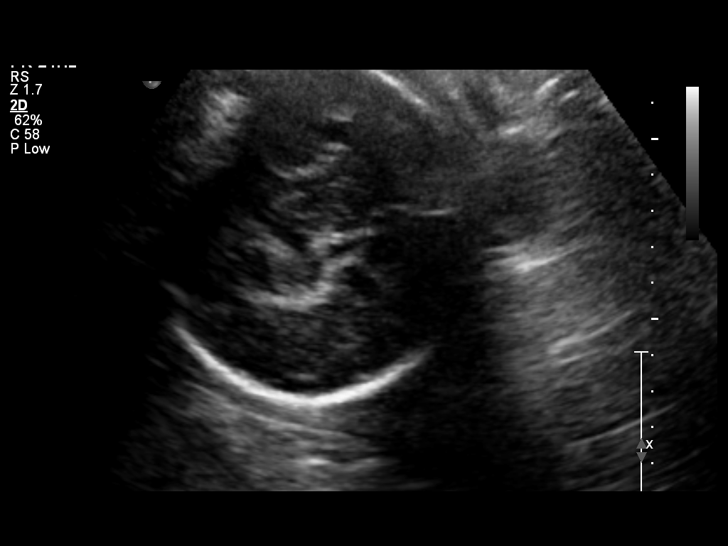
[im 19/33]
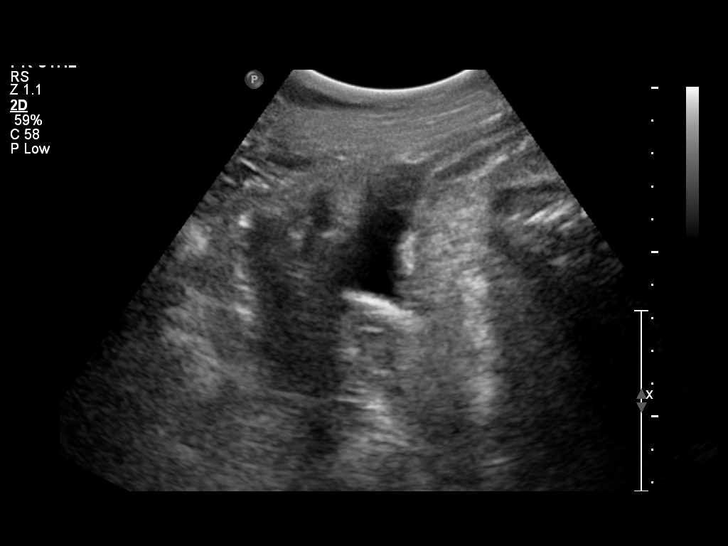
[im 22/33]
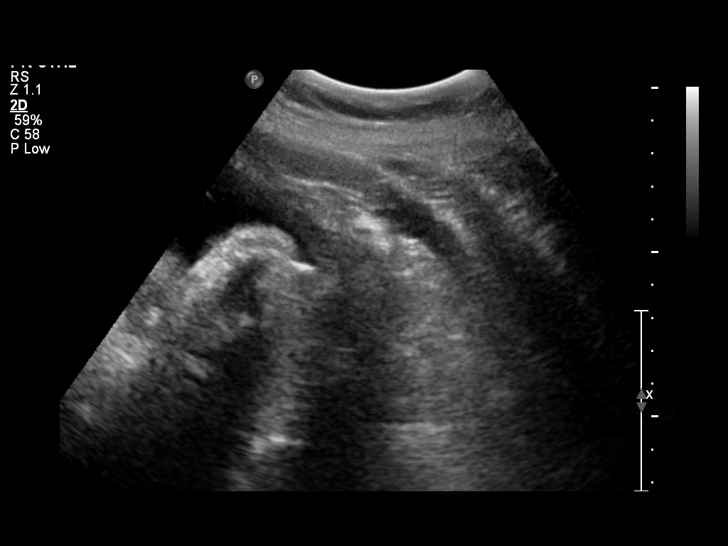
[im 24/33]
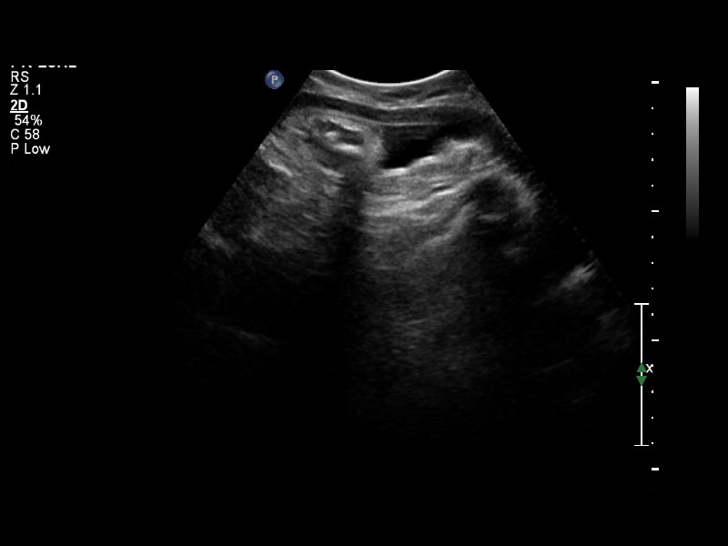
[im 27/33]
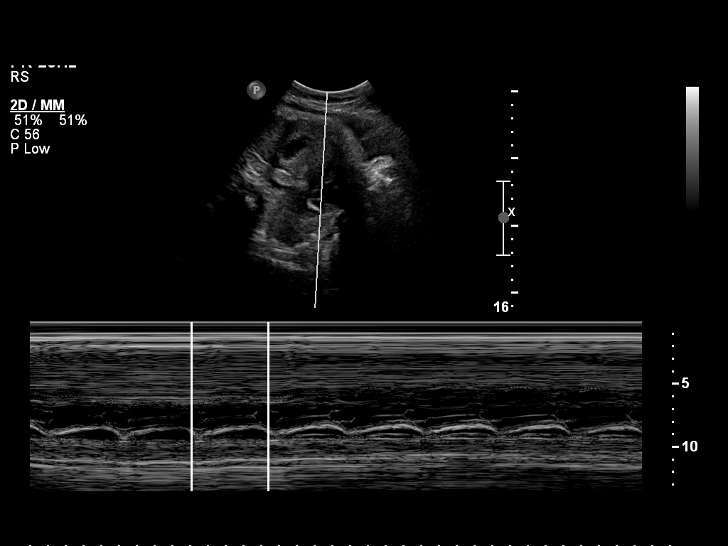
[im 29/33]
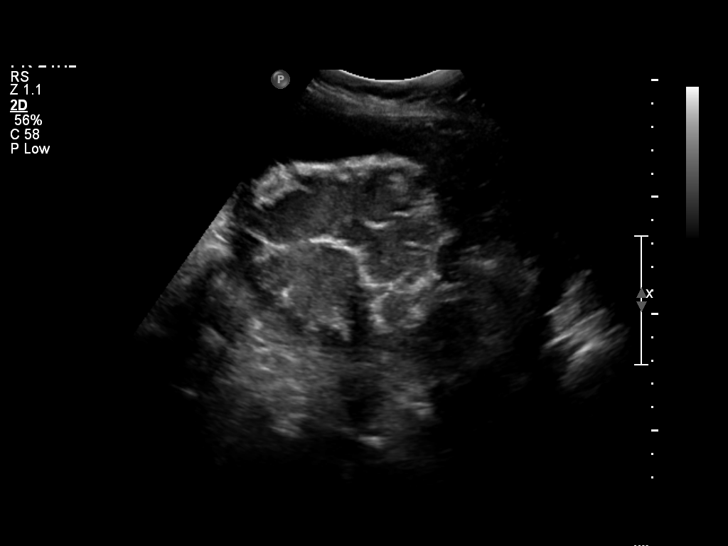
[im 31/33]
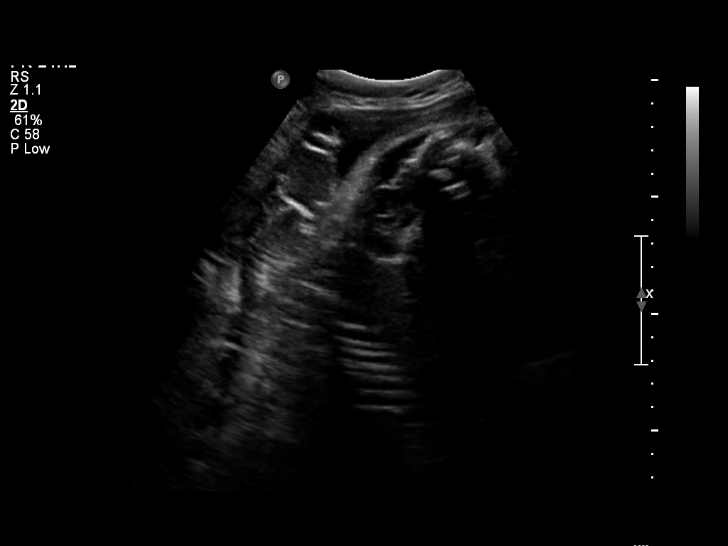

[13 of 28 positions shown; findings below may reference images not displayed]

OBSTETRICS REPORT
                      (Signed Final 11/20/2010 [DATE])

                 30_E
Procedures

 [HOSPITAL]                                         76815.0
Indications

 Decreased fetal movement
 Assess amniotic fluid and placenta
 Vaginal bleeding, unknown etiology
Fetal Evaluation

 Fetal Heart Rate:  143                          bpm
 Cardiac Activity:  Observed
 Presentation:      Cephalic
 Placenta:          Posterior, above cervical
                    os
 P. Cord            Not well visualized
 Insertion:

 Comment:    No placental abruption or previa identified.  Breathing
             noted intermittently, but not sustained.

 Amniotic Fluid
 AFI FV:      Subjectively within normal limits
 AFI Sum:     10.96   cm       32  %Tile     Larg Pckt:    4.08  cm
 RUQ:   1.54    cm   RLQ:    2.9    cm    LUQ:   4.08    cm   LLQ:    2.44   cm
Biophysical Evaluation

 Amniotic F.V:   Within normal limits       F. Tone:        Observed
 F. Movement:    Observed                   Score:          [DATE]
 F. Breathing:   Not Observed
Gestational Age

 LMP:           37w 2d        Date:  03/03/10                 EDD:   12/08/10
 Best:          37w 2d     Det. By:  LMP  (03/03/10)          EDD:   12/08/10
Cervix Uterus Adnexa

 Cervix:       Not visualized (advanced GA >34 wks)

 Adnexa:     No abnormality visualized.
Impression

 Single living intrauterine fetus in Cephalic presentation.
 Amniotic fluid within normal limits, with AFI of 10.96 cm.
 No placental abruption or previa identifed.
 Biophysical profile score of [DATE].

 questions or concerns.

## 2012-05-31 ENCOUNTER — Other Ambulatory Visit: Payer: Self-pay | Admitting: Obstetrics and Gynecology

## 2012-06-07 ENCOUNTER — Inpatient Hospital Stay (HOSPITAL_COMMUNITY)
Admission: AD | Admit: 2012-06-07 | Discharge: 2012-06-08 | Disposition: A | Discharge status: Home / Self Care | Payer: Medicaid Other | Source: Ambulatory Visit | Attending: Obstetrics and Gynecology | Admitting: Obstetrics and Gynecology

## 2012-06-07 ENCOUNTER — Encounter (HOSPITAL_COMMUNITY): Payer: Self-pay | Admitting: *Deleted

## 2012-06-07 DIAGNOSIS — O479 False labor, unspecified: Secondary | ICD-10-CM | POA: Insufficient documentation

## 2012-06-07 MED ORDER — NIFEDIPINE 10 MG PO CAPS
10.0000 mg | ORAL_CAPSULE | Freq: Once | ORAL | Status: AC
  Administered 2012-06-07: 10 mg via ORAL
  Filled 2012-06-07: qty 1

## 2012-06-07 NOTE — MAU Note (Signed)
Pt states she started having contractions today at 1300 pt states she has also had some rectal pressure

## 2012-06-23 ENCOUNTER — Encounter (HOSPITAL_COMMUNITY): Payer: Self-pay | Admitting: Family

## 2012-06-23 ENCOUNTER — Inpatient Hospital Stay (HOSPITAL_COMMUNITY)
Admission: AD | Admit: 2012-06-23 | Discharge: 2012-06-25 | DRG: 776 | Disposition: A | Discharge status: Home / Self Care | Payer: Medicaid Other | Source: Ambulatory Visit | Attending: Obstetrics and Gynecology | Admitting: Obstetrics and Gynecology

## 2012-06-23 HISTORY — DX: Bipolar disorder, unspecified: F31.9

## 2012-06-23 HISTORY — DX: Anxiety disorder, unspecified: F41.9

## 2012-06-23 MED ORDER — SIMETHICONE 80 MG PO CHEW: 80.0000 mg | CHEWABLE_TABLET | ORAL | Status: DC | PRN

## 2012-06-23 MED ORDER — WITCH HAZEL-GLYCERIN EX PADS: 1.0000 "application " | MEDICATED_PAD | CUTANEOUS | Status: DC | PRN

## 2012-06-23 MED ORDER — BENZOCAINE-MENTHOL 20-0.5 % EX AERO
1.0000 "application " | INHALATION_SPRAY | CUTANEOUS | Status: DC | PRN
  Administered 2012-06-23: 1 via TOPICAL
  Filled 2012-06-23: qty 56

## 2012-06-23 MED ORDER — ZOLPIDEM TARTRATE 5 MG PO TABS: 5.0000 mg | ORAL_TABLET | Freq: Every evening | ORAL | Status: DC | PRN

## 2012-06-23 MED ORDER — ONDANSETRON HCL 4 MG/2ML IJ SOLN: 4.0000 mg | INTRAMUSCULAR | Status: DC | PRN

## 2012-06-23 MED ORDER — OXYCODONE-ACETAMINOPHEN 5-325 MG PO TABS
1.0000 | ORAL_TABLET | ORAL | Status: DC | PRN
  Administered 2012-06-23 – 2012-06-24 (×3): 1 via ORAL
  Filled 2012-06-23 (×3): qty 1

## 2012-06-23 MED ORDER — DIPHENHYDRAMINE HCL 25 MG PO CAPS: 25.0000 mg | ORAL_CAPSULE | Freq: Four times a day (QID) | ORAL | Status: DC | PRN

## 2012-06-23 MED ORDER — ONDANSETRON HCL 4 MG PO TABS: 4.0000 mg | ORAL_TABLET | ORAL | Status: DC | PRN

## 2012-06-23 MED ORDER — PRENATAL MULTIVITAMIN CH
1.0000 | ORAL_TABLET | Freq: Every day | ORAL | Status: DC
  Administered 2012-06-24: 1 via ORAL
  Filled 2012-06-23 (×2): qty 1

## 2012-06-23 MED ORDER — SENNOSIDES-DOCUSATE SODIUM 8.6-50 MG PO TABS
2.0000 | ORAL_TABLET | Freq: Every day | ORAL | Status: DC
  Administered 2012-06-24: 2 via ORAL

## 2012-06-23 MED ORDER — TETANUS-DIPHTH-ACELL PERTUSSIS 5-2.5-18.5 LF-MCG/0.5 IM SUSP
0.5000 mL | Freq: Once | INTRAMUSCULAR | Status: AC
  Administered 2012-06-24: 0.5 mL via INTRAMUSCULAR

## 2012-06-23 MED ORDER — IBUPROFEN 600 MG PO TABS
600.0000 mg | ORAL_TABLET | Freq: Four times a day (QID) | ORAL | Status: DC
  Administered 2012-06-23 – 2012-06-25 (×7): 600 mg via ORAL
  Filled 2012-06-23 (×9): qty 1

## 2012-06-23 MED ORDER — LANOLIN HYDROUS EX OINT: TOPICAL_OINTMENT | CUTANEOUS | Status: DC | PRN

## 2012-06-23 MED ORDER — DIBUCAINE 1 % RE OINT: 1.0000 "application " | TOPICAL_OINTMENT | RECTAL | Status: DC | PRN

## 2012-06-23 NOTE — MAU Note (Signed)
Patient arrives to MAU via EMS after home delivery of baby girl. Reports G4P4, patient of [facility]. Denies DM or other prenatal complications.  Reports hx of precipitous deliveries. Reports meconium-stained fluid at 0800 ROM. Reports vaginal delivery in child's bedroom at 0830. Reports placenta delivered at 0845.  EMS reports APGARS of 5 and 10; baby's CBG 71; HR 160, RR 42.

## 2012-06-23 NOTE — H&P (Signed)
27 y.o. [redacted]w[redacted]d  [id number] comes in having delivered at home including placents.  Otherwise has good fetal movement and no bleeding. Reports hx of precipitous deliveries. Reports meconium-stained fluid at 0800 ROM. Reports vaginal delivery in child's bedroom at 0830. Reports placenta delivered at 0845.  EMS reports APGARS of 5 and 10; baby's CBG 71; HR 160, RR 42.      GBS negative.    Past Medical History  Diagnosis Date  . Personal history of kidney stones 2011  . Depression     Past Surgical History  Procedure Laterality Date  . Kidney stone surgery  12/2009    OB History   Grav Para Term Preterm Abortions TAB SAB Ect Mult Living   5 3 3  1  1   3      # Outc Date GA Lbr Len/2nd Wgt Sex Del Anes PTL Lv   1 TRM 10/12 [redacted]w[redacted]d 13:40 / 00:02 3.496kg(7lb11.3oz) F SVD None  Yes   2 TRM  [redacted]w[redacted]d 18:00 3.629kg(8lb) F VAC EPI  Yes   3 TRM  [redacted]w[redacted]d  1.478GN(5AO1HY) F SVD EPI  Yes   4 SAB            5 CUR               History   Social History  . Marital Status: Married    Spouse Name: N/A    Number of Children: N/A  . Years of Education: N/A   Occupational History  . Not on file.   Social History Main Topics  . Smoking status: Former Smoker -- 1.00 packs/day for 8.5 years    Types: Cigarettes    Quit date: 10/31/2006  . Smokeless tobacco: Never Used  . Alcohol Use: No     Comment: stopped drinking in 2008  . Drug Use: No     Comment: Pt has H/O drug abuse (stopped in 2008); "Spanish Crystalmeth"  . Sexually Active: Yes    Birth Control/ Protection: None   Other Topics Concern  . Not on file   Social History Narrative  . No narrative on file   Review of patient's allergies indicates no known allergies.    Prenatal Transfer Tool  Maternal Diabetes: No Genetic Screening: Normal Maternal Ultrasounds/Referrals: Normal Fetal Ultrasounds or other Referrals:  None Maternal Substance Abuse:  No Significant Maternal Medications:  None Significant Maternal Lab Results:  None  Other QMV:[id number].    There were no vitals filed for this visit.   Lungs/Cor:  NAD Abdomen:  soft, gravid Ex:  no cords, erythema Vulva and perineum intact.  A/P   Delivered at home.  Routine pp care.  GBS neg.  Baby at bedside.  Consider pp antidepressants.  Placenta examined and was intact.   Mang Hazelrigg A

## 2012-06-24 LAB — RPR: RPR Ser Ql: NONREACTIVE

## 2012-06-24 LAB — CBC
HCT: 33.3 % — ABNORMAL LOW (ref 36.0–46.0)
Platelets: 210 10*3/uL (ref 150–400)
RDW: 14 % (ref 11.5–15.5)
WBC: 12.3 10*3/uL — ABNORMAL HIGH (ref 4.0–10.5)

## 2012-06-24 NOTE — Progress Notes (Addendum)
Patient is eating, ambulating, voiding.  Pain control is good.  Filed Vitals:   06/23/12 1600 06/23/12 1813 06/23/12 2335 06/24/12 0602  BP: 115/74 120/78 116/76 101/63  Pulse: 92 81 101 80  Temp: 98.3 F (36.8 C) 98.2 F (36.8 C) 98.3 F (36.8 C) 97.6 F (36.4 C)  TempSrc: Oral Oral Oral Oral  Resp: 18 18 18 18   SpO2:   96%     Fundus firm Perineum without swelling.  No lab yet.   A+/RI  A/P Post partum day 1.  Routine care.  Expect d/c tomorrow.    Vicy Medico A

## 2012-06-24 NOTE — Clinical Social Work Note (Signed)
Clinical Social Work Department PSYCHOSOCIAL ASSESSMENT - MATERNAL/CHILD 06/24/2012  Patient:  Misty Gentry,Misty Gentry  Account Number:  [id number]  Admit Date:  06/23/2012  Childs Name:    Clinical Social Worker:  Vitalia Shery [place]un[place], LCSW   Date/Time:  06/24/2012 10:00 AM  Date Referred:  06/24/2012   Referral source  Physician     Referred reason  Depression/Anxiety  Substance Abuse   Other referral source:    I:  FAMILY / HOME ENVIRONMENT Child's legal guardian:  PARENT  Guardian - Name Guardian - Age Guardian - Address  Adeola Go [address] [place] [address] [place] ,[place] [address]  Mr. Welcome  [address] [place] ,[place] [address]   Other household support members/support persons Name Relationship DOB  3 other children in home     Other support:   MOB reports good family support.    II  PSYCHOSOCIAL DATA Information Source:  Patient Interview  Financial and Community Resources Employment:   MOB unemployed, FOB reported working   Financial resources:  Medicaid If Medicaid - County:  [place] Other  WIC  Food Stamps   School / Grade:   Maternity Care Coordinator / Child Services Coordination / Early Interventions:  Cultural issues impacting care:    III  STRENGTHS Strengths  Compliance with medical plan  Adequate Resources  Home prepared for Child (including basic supplies)  Supportive family/friends   Strength comment:    IV  RISK FACTORS AND CURRENT PROBLEMS Current Problem:  None   Risk Factor & Current Problem Patient Issue Family Issue Risk Factor / Current Problem Comment   N N     Gentry  SOCIAL WORK ASSESSMENT CSW spoke with MOB at bedside.  CSW discussed emotional stability and hx of diagnosis of Bi-polar.  MOB reports just two weeks ago she was diagnosed by a psychiatrist with Bipolar and Anxiety and is about to start medication management now that the baby is born and counseling services.  MOB reports no current concerns and expressed she will let  RN or CSW know if any emotional concerns arise. CSW discussed hx of drug abuse.  MOB confirms this was in 2008 and no current concerns. MD reports no current concerns and no drug screen completed. MOB reports no concerns with supplies or family support.  MOB currently has medicaid, along with WIC, and food stamps.  No further concerns at this time and no barriers to discharge.  Please reconsult CSW if further needs arise.      VI SOCIAL WORK PLAN Social Work Plan  No Further Intervention Required / No Barriers to Discharge   Type of pt/family education:   If child protective services report - county:   If child protective services report - date:   Information/referral to community resources comment:   Other social work plan:    

## 2012-06-24 NOTE — Discharge Summary (Signed)
Obstetric Discharge Summary Reason for Admission: onset of labor Prenatal Procedures: none Intrapartum Procedures: spontaneous vaginal delivery Postpartum Procedures: none Complications-Operative and Postpartum: none Hemoglobin  Date Value Range Status  11/03/2011 11.9* 12.0 - 15.0 g/dL Final     HCT  Date Value Range Status  11/03/2011 34.5* 36.0 - 46.0 % Final    Discharge Diagnoses: Term Pregnancy-delivered  Discharge Information: Date: 06/24/2012 Activity: pelvic rest Diet: routine Medications: Ibuprofen Condition: stable Instructions: refer to practice specific booklet Discharge to: home Follow-up Information   Follow up with Izen Petz A, MD. Schedule an appointment as soon as possible for a visit in 4 weeks.   Contact information:   [address] [place] [address] [phone]       Newborn Data: Live born female  Birth Weight: 8 lb 12.6 oz (3985 g) APGAR: ,   Home with mother.  Carloyn Lahue A 06/24/2012, 7:38 AM

## 2012-06-25 NOTE — Progress Notes (Signed)
Patient is eating, ambulating, voiding.  Pain control is good.  Filed Vitals:   06/23/12 2335 06/24/12 0602 06/24/12 1758 06/25/12 0526  BP: 116/76 101/63 134/84 104/64  Pulse: 101 80 84 81  Temp: 98.3 F (36.8 C) 97.6 F (36.4 C) 98 F (36.7 C) 97.7 F (36.5 C)  TempSrc: Oral Oral Oral Oral  Resp: 18 18 20 19   SpO2: 96%   99%    Fundus firm Perineum without swelling.  Lab Results  Component Value Date   WBC 12.3* 06/24/2012   HGB 11.2* 06/24/2012   HCT 33.3* 06/24/2012   MCV 90.2 06/24/2012   PLT 210 06/24/2012    A+/RI  A/P Post partum day 2.  Routine care.  Expect d/c today.    Beaumont Austad A

## 2012-06-26 NOTE — Progress Notes (Signed)
Post discharge chart review completed.  

## 2012-08-10 ENCOUNTER — Encounter (HOSPITAL_COMMUNITY): Payer: Self-pay | Admitting: Pharmacist

## 2012-08-11 MED ORDER — FENTANYL CITRATE 0.05 MG/ML IJ SOLN
INTRAMUSCULAR | Status: AC
  Filled 2012-08-11: qty 2

## 2012-08-17 ENCOUNTER — Encounter (HOSPITAL_COMMUNITY): Payer: Self-pay | Admitting: *Deleted

## 2012-08-17 NOTE — H&P (Signed)
Misty Gentry is an 27 y.o. female. She is admitted for voluntary sterilization.  Pertinent Gynecological History: Menses: 2 months post partum OB History: G5, P4014    Past Medical History  Diagnosis Date  . Personal history of kidney stones 2011  . Anxiety   . Depression   . Bipolar 1 disorder     Past Surgical History  Procedure Laterality Date  . Kidney stone surgery  12/2009    Family History  Problem Relation Age of Onset  . Anesthesia problems Neg Hx   . Hypotension Neg Hx   . Malignant hyperthermia Neg Hx   . Pseudochol deficiency Neg Hx   . Other Neg Hx     Social History:  reports that she quit smoking about 5 years ago. Her smoking use included Cigarettes. She has a 8.5 pack-year smoking history. She has never used smokeless tobacco. She reports that she does not drink alcohol or use illicit drugs.  Allergies: No Known Allergies  No prescriptions prior to admission    Review of Systems  Constitutional: Negative.   HENT: Negative.   Eyes: Negative.   Respiratory: Negative.   Cardiovascular: Negative.   Gastrointestinal: Negative.   Genitourinary: Negative.   Musculoskeletal: Negative.   Skin: Negative.   Neurological: Negative.   Endo/Heme/Allergies: Negative.   Psychiatric/Behavioral: Negative.     unknown if currently breastfeeding. Physical Exam  Constitutional: She is oriented to person, place, and time. She appears well-developed and well-nourished.  HENT:  Head: Normocephalic.  Eyes: Pupils are equal, round, and reactive to light.  Neck: No thyromegaly present.  Cardiovascular: Normal rate.   Respiratory: Effort normal.  GI: Soft.  Genitourinary: Vagina normal and uterus normal.  Musculoskeletal: Normal range of motion.  Neurological: She is alert and oriented to person, place, and time.  Skin: Skin is warm and dry.  Psychiatric: She has a normal mood and affect.    No results found for this or any previous visit (from the past 24  hour(s)).  No results found.  Assessment/Plan:  Desires sterilization.  Will proceed with laparoscopic tubal cautery.   Sonny Poth D 08/17/2012, 1:13 PM

## 2012-08-18 ENCOUNTER — Ambulatory Visit (HOSPITAL_COMMUNITY)
Admission: RE | Admit: 2012-08-18 | Discharge: 2012-08-18 | Disposition: A | Discharge status: Home / Self Care | Payer: Medicaid Other | Source: Ambulatory Visit | Attending: Obstetrics & Gynecology | Admitting: Obstetrics & Gynecology

## 2012-08-18 ENCOUNTER — Encounter (HOSPITAL_COMMUNITY): Payer: Self-pay | Admitting: Anesthesiology

## 2012-08-18 ENCOUNTER — Ambulatory Visit (HOSPITAL_COMMUNITY): Payer: Medicaid Other | Admitting: Anesthesiology

## 2012-08-18 ENCOUNTER — Encounter (HOSPITAL_COMMUNITY)
Admission: RE | Disposition: A | Discharge status: Home / Self Care | Payer: Self-pay | Source: Ambulatory Visit | Attending: Obstetrics & Gynecology

## 2012-08-18 DIAGNOSIS — Z302 Encounter for sterilization: Secondary | ICD-10-CM | POA: Insufficient documentation

## 2012-08-18 HISTORY — PX: LAPAROSCOPIC TUBAL LIGATION: SHX1937

## 2012-08-18 LAB — CBC
HCT: 38.7 % (ref 36.0–46.0)
Hemoglobin: 13 g/dL (ref 12.0–15.0)
MCH: 29.2 pg (ref 26.0–34.0)
MCHC: 33.6 g/dL (ref 30.0–36.0)
MCV: 87 fL (ref 78.0–100.0)

## 2012-08-18 SURGERY — LIGATION, FALLOPIAN TUBE, LAPAROSCOPIC
Anesthesia: General | Site: Abdomen | Laterality: Bilateral | Wound class: Clean Contaminated

## 2012-08-18 MED ORDER — KETOROLAC TROMETHAMINE 30 MG/ML IJ SOLN
INTRAMUSCULAR | Status: AC
  Filled 2012-08-18: qty 1

## 2012-08-18 MED ORDER — FLUMAZENIL 1 MG/10ML IV SOLN
INTRAVENOUS | Status: AC
  Filled 2012-08-18: qty 10

## 2012-08-18 MED ORDER — ONDANSETRON HCL 4 MG/2ML IJ SOLN
INTRAMUSCULAR | Status: DC | PRN
  Administered 2012-08-18: 4 mg via INTRAVENOUS

## 2012-08-18 MED ORDER — ROCURONIUM BROMIDE 100 MG/10ML IV SOLN
INTRAVENOUS | Status: DC | PRN
  Administered 2012-08-18: 25 mg via INTRAVENOUS

## 2012-08-18 MED ORDER — NEOSTIGMINE METHYLSULFATE 1 MG/ML IJ SOLN
INTRAMUSCULAR | Status: AC
  Filled 2012-08-18: qty 1

## 2012-08-18 MED ORDER — MIDAZOLAM HCL 2 MG/2ML IJ SOLN
INTRAMUSCULAR | Status: AC
  Filled 2012-08-18: qty 2

## 2012-08-18 MED ORDER — ONDANSETRON HCL 4 MG/2ML IJ SOLN
INTRAMUSCULAR | Status: AC
  Filled 2012-08-18: qty 2

## 2012-08-18 MED ORDER — KETOROLAC TROMETHAMINE 30 MG/ML IJ SOLN: 15.0000 mg | Freq: Once | INTRAMUSCULAR | Status: DC | PRN

## 2012-08-18 MED ORDER — PROPOFOL 10 MG/ML IV EMUL
INTRAVENOUS | Status: AC
  Filled 2012-08-18: qty 20

## 2012-08-18 MED ORDER — GLYCOPYRROLATE 0.2 MG/ML IJ SOLN
INTRAMUSCULAR | Status: DC | PRN
  Administered 2012-08-18: 0.6 mg via INTRAVENOUS

## 2012-08-18 MED ORDER — DEXAMETHASONE SODIUM PHOSPHATE 10 MG/ML IJ SOLN
INTRAMUSCULAR | Status: AC
  Filled 2012-08-18: qty 1

## 2012-08-18 MED ORDER — FENTANYL CITRATE 0.05 MG/ML IJ SOLN
INTRAMUSCULAR | Status: DC | PRN
  Administered 2012-08-18 (×3): 50 ug via INTRAVENOUS

## 2012-08-18 MED ORDER — MIDAZOLAM HCL 5 MG/5ML IJ SOLN
INTRAMUSCULAR | Status: DC | PRN
  Administered 2012-08-18: 2 mg via INTRAVENOUS

## 2012-08-18 MED ORDER — NEOSTIGMINE METHYLSULFATE 1 MG/ML IJ SOLN
INTRAMUSCULAR | Status: DC | PRN
  Administered 2012-08-18: 3 mg via INTRAVENOUS

## 2012-08-18 MED ORDER — LIDOCAINE HCL (CARDIAC) 20 MG/ML IV SOLN
INTRAVENOUS | Status: AC
  Filled 2012-08-18: qty 5

## 2012-08-18 MED ORDER — SCOPOLAMINE 1 MG/3DAYS TD PT72
MEDICATED_PATCH | TRANSDERMAL | Status: AC
  Administered 2012-08-18: 1.5 mg via TRANSDERMAL
  Filled 2012-08-18: qty 1

## 2012-08-18 MED ORDER — SCOPOLAMINE 1 MG/3DAYS TD PT72: 1.0000 | MEDICATED_PATCH | TRANSDERMAL | Status: DC

## 2012-08-18 MED ORDER — FENTANYL CITRATE 0.05 MG/ML IJ SOLN
INTRAMUSCULAR | Status: AC
  Filled 2012-08-18: qty 5

## 2012-08-18 MED ORDER — ROCURONIUM BROMIDE 50 MG/5ML IV SOLN
INTRAVENOUS | Status: AC
  Filled 2012-08-18: qty 1

## 2012-08-18 MED ORDER — OXYCODONE-ACETAMINOPHEN 5-325 MG PO TABS: 1.0000 | ORAL_TABLET | ORAL | Status: DC | PRN

## 2012-08-18 MED ORDER — MEPERIDINE HCL 25 MG/ML IJ SOLN: 6.2500 mg | INTRAMUSCULAR | Status: DC | PRN

## 2012-08-18 MED ORDER — ONDANSETRON HCL 4 MG/2ML IJ SOLN: 4.0000 mg | Freq: Once | INTRAMUSCULAR | Status: DC | PRN

## 2012-08-18 MED ORDER — LIDOCAINE HCL (CARDIAC) 20 MG/ML IV SOLN
INTRAVENOUS | Status: DC | PRN
  Administered 2012-08-18: 60 mg via INTRAVENOUS
  Administered 2012-08-18: 30 mg via INTRAVENOUS

## 2012-08-18 MED ORDER — LACTATED RINGERS IV SOLN
INTRAVENOUS | Status: DC
  Administered 2012-08-18 (×2): via INTRAVENOUS
  Administered 2012-08-18: 125 mL/h via INTRAVENOUS

## 2012-08-18 MED ORDER — PROPOFOL 10 MG/ML IV BOLUS
INTRAVENOUS | Status: DC | PRN
  Administered 2012-08-18: 180 mg via INTRAVENOUS

## 2012-08-18 MED ORDER — KETOROLAC TROMETHAMINE 30 MG/ML IJ SOLN
INTRAMUSCULAR | Status: DC | PRN
  Administered 2012-08-18: 30 mg via INTRAVENOUS

## 2012-08-18 MED ORDER — FLUMAZENIL 0.5 MG/5ML IV SOLN
INTRAVENOUS | Status: DC | PRN
  Administered 2012-08-18: 0.2 mg via INTRAVENOUS

## 2012-08-18 MED ORDER — FENTANYL CITRATE 0.05 MG/ML IJ SOLN: 25.0000 ug | INTRAMUSCULAR | Status: DC | PRN

## 2012-08-18 MED ORDER — EPHEDRINE SULFATE 50 MG/ML IJ SOLN
INTRAMUSCULAR | Status: AC
  Filled 2012-08-18: qty 1

## 2012-08-18 MED ORDER — GLYCOPYRROLATE 0.2 MG/ML IJ SOLN
INTRAMUSCULAR | Status: AC
  Filled 2012-08-18: qty 3

## 2012-08-18 MED ORDER — DEXAMETHASONE SODIUM PHOSPHATE 10 MG/ML IJ SOLN
INTRAMUSCULAR | Status: DC | PRN
  Administered 2012-08-18: 10 mg via INTRAVENOUS

## 2012-08-18 SURGICAL SUPPLY — 16 items
APPLICATOR COTTON TIP 6IN STRL (MISCELLANEOUS) ×2 IMPLANT
CATH ROBINSON RED A/P 16FR (CATHETERS) ×2 IMPLANT
CLOTH BEACON ORANGE TIMEOUT ST (SAFETY) ×2 IMPLANT
DERMABOND ADVANCED (GAUZE/BANDAGES/DRESSINGS) ×1
DERMABOND ADVANCED .7 DNX12 (GAUZE/BANDAGES/DRESSINGS) ×1 IMPLANT
GLOVE ECLIPSE 6.0 STRL STRAW (GLOVE) ×2 IMPLANT
GLOVE ECLIPSE 6.5 STRL STRAW (GLOVE) ×2 IMPLANT
GOWN PREVENTION PLUS LG XLONG (DISPOSABLE) ×4 IMPLANT
PACK LAPAROSCOPY BASIN (CUSTOM PROCEDURE TRAY) ×2 IMPLANT
SOLUTION ELECTROLUBE (MISCELLANEOUS) ×2 IMPLANT
STRIP CLOSURE SKIN 1/4X3 (GAUZE/BANDAGES/DRESSINGS) ×2 IMPLANT
SUT VICRYL 4-0 PS2 18IN ABS (SUTURE) IMPLANT
TOWEL OR 17X24 6PK STRL BLUE (TOWEL DISPOSABLE) ×4 IMPLANT
TROCAR OPTI TIP 5M 100M (ENDOMECHANICALS) ×2 IMPLANT
WARMER LAPAROSCOPE (MISCELLANEOUS) ×2 IMPLANT
WATER STERILE IRR 1000ML POUR (IV SOLUTION) ×2 IMPLANT

## 2012-08-18 NOTE — Anesthesia Preprocedure Evaluation (Addendum)
Anesthesia Evaluation  Patient identified by MRN, date of birth, ID band Patient awake    Reviewed: Allergy & Precautions, H&P , NPO status , Patient's Chart, lab work & pertinent test results  Airway Mallampati: II TM Distance: >3 FB Neck ROM: full    Dental no notable dental hx.    Pulmonary neg pulmonary ROS,    Pulmonary exam normal       Cardiovascular negative cardio ROS  Rate:Normal     Neuro/Psych negative neurological ROS  negative psych ROS   GI/Hepatic negative GI ROS, Neg liver ROS,   Endo/Other  Morbid obesity  Renal/GU negative Renal ROS  negative genitourinary   Musculoskeletal negative musculoskeletal ROS (+)   Abdominal Normal abdominal exam  (+)   Peds negative pediatric ROS (+)  Hematology negative hematology ROS (+)   Anesthesia Other Findings   Reproductive/Obstetrics                          Anesthesia Physical Anesthesia Plan  ASA: III  Anesthesia Plan: General   Post-op Pain Management:    Induction: Intravenous  Airway Management Planned: Oral ETT  Additional Equipment:   Intra-op Plan:   Post-operative Plan: Extubation in OR  Informed Consent: I have reviewed the patients History and Physical, chart, labs and discussed the procedure including the risks, benefits and alternatives for the proposed anesthesia with the patient or authorized representative who has indicated his/her understanding and acceptance.     Plan Discussed with: Surgeon and CRNA  Anesthesia Plan Comments:        Anesthesia Quick Evaluation

## 2012-08-18 NOTE — Anesthesia Postprocedure Evaluation (Signed)
Anesthesia Post Note  Patient: Misty Gentry  Procedure(s) Performed: Procedure(s) (LRB): LAPAROSCOPIC TUBAL LIGATION (Bilateral)  Anesthesia type: General  Patient location: PACU  Post pain: Pain level controlled  Post assessment: Post-op Vital signs reviewed  Last Vitals:  Filed Vitals:   08/18/12 1115  BP: 101/56  Pulse: 71  Temp: 36.7 C  Resp: 17    Post vital signs: Reviewed  Level of consciousness: sedated  Complications: No apparent anesthesia complications

## 2012-08-18 NOTE — Op Note (Signed)
Patient Name: Misty Gentry MRN: [id number]  Date of Surgery: 08/18/2012    PREOPERATIVE DIAGNOSIS: DESIRES STERLIZATION  POSTOPERATIVE DIAGNOSIS: DESIRES STERLIZATION   PROCEDURE: Laparoscopy Bilateral tubal cautery  SURGEON: Caralyn Guile. Arlyce Dice M.D.  ANESTHESIA: General  ESTIMATED BLOOD LOSS: * No blood loss amount entered *  FINDINGS: Normal pelvis and adnexa   COMPLICATIONS: None  INDICATIONS: Voluntary sterilization.  PROCEDURE IN DETAIL:  The abdomen, vagina, and perineum were prepped and draped in sterile fashion.  The bladder was catheterized.  A Hulka tenaculum was placed into the endometrial canal and fixed to the anterior lip of the cervix.  The surgeon re- gowned and gloved.  An incision was made at the umbilicus and the Veress needle was placed to create a pneumoperitoneum.  A 5 mm laparoscope was introduced and an accessory 5 mm port was placed under direct visualization in the right lower quadrant.  The right fallopian tube was grasped with the Kleppinger forcep and cauterized with bipolar currant along a 4 cm length leaving 1.5 cm of normal tube proximal to the burn site.  The same procedure was performed on the left tube.   The pneumoperitoneum was released.  The incisions were closed with derma bond.  The Hulka tenaculum was removed.  All sponge and instrument counts were correct.  The patient tolerated the procedure well and left the operating room in good condition.  Jevaughn Degollado D. Arlyce Dice, M.D.

## 2012-08-18 NOTE — Transfer of Care (Signed)
Immediate Anesthesia Transfer of Care Note  Patient: Misty Gentry  Procedure(s) Performed: Procedure(s): LAPAROSCOPIC TUBAL LIGATION (Bilateral)  Patient Location: PACU  Anesthesia Type:General  Level of Consciousness: awake, oriented and patient cooperative  Airway & Oxygen Therapy: Patient Spontanous Breathing and Patient connected to nasal cannula oxygen  Post-op Assessment: Report given to PACU RN and Post -op Vital signs reviewed and stable  Post vital signs: Reviewed and stable  Complications: No apparent anesthesia complications

## 2012-08-18 NOTE — Progress Notes (Signed)
I have interviewed and performed the pertinent exams on my patient to confirm that there have been no significant changes in her condition since the dictation of her history and physical exam.  

## 2012-08-21 ENCOUNTER — Encounter (HOSPITAL_COMMUNITY): Payer: Self-pay | Admitting: Obstetrics & Gynecology

## 2013-02-09 IMAGING — US US OB COMP LESS 14 WK
1 series · 14 of 22 positions shown · non-contrast
Comparison: None.

CLINICAL DATA: Pelvic pain

OBSTETRIC <14 WK US AND TRANSVAGINAL OB US
TECHNIQUE: Both transabdominal and transvaginal ultrasound
examinations were performed for complete evaluation of the
gestation as well as the maternal uterus, adnexal regions, and
pelvic cul-de-sac.  Transvaginal technique was performed to assess
early pregnancy.

[Series 1: us ob comp less 14 wks · 22 acquisitions, 14 frames shown]
[im 1/22]
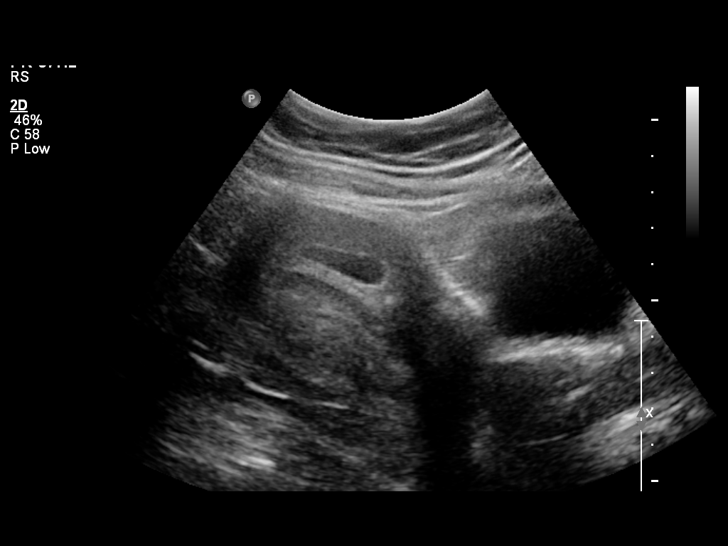
[im 3/22]
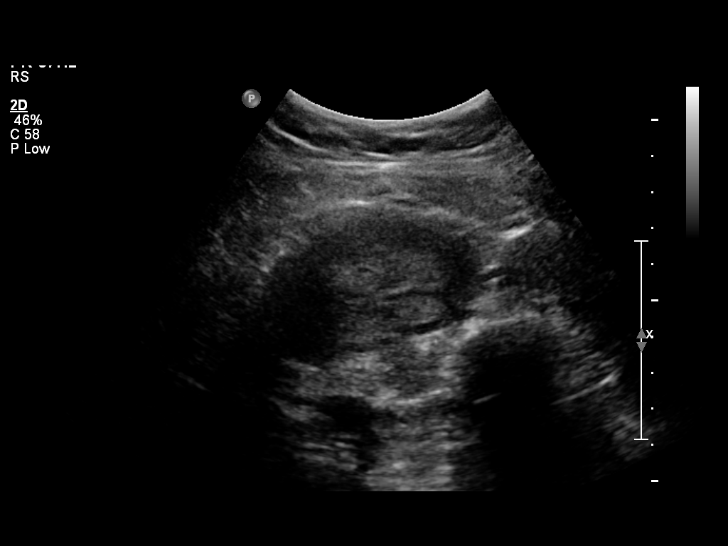
[im 4/22]
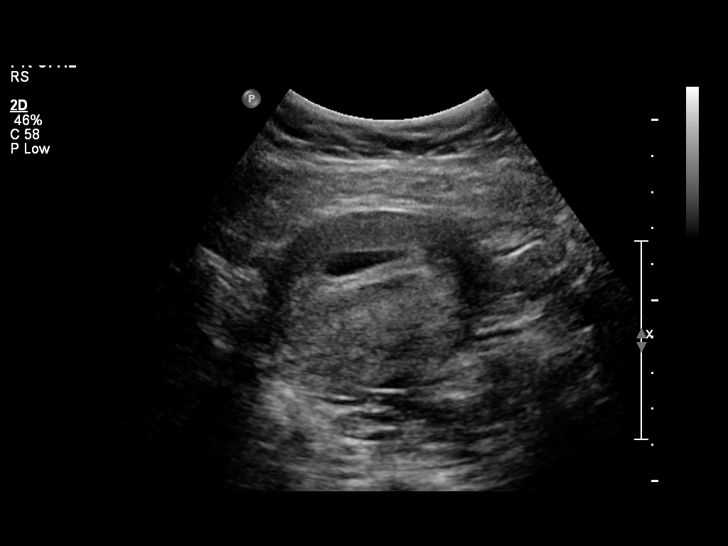
[im 6/22]
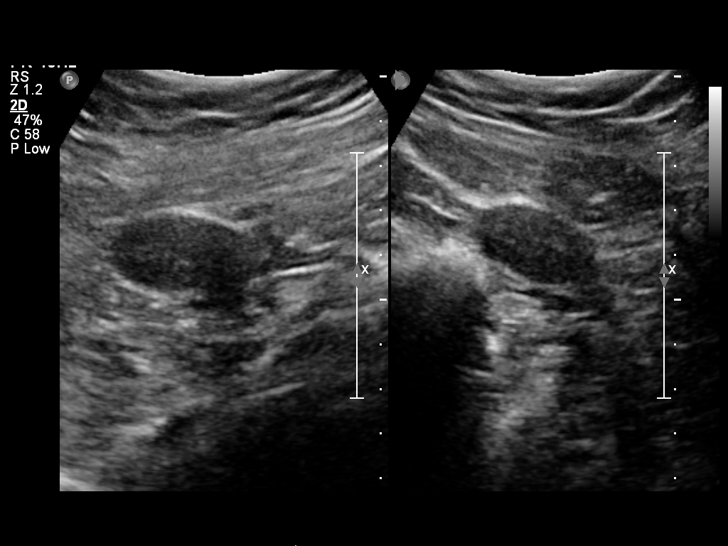
[im 8/22]
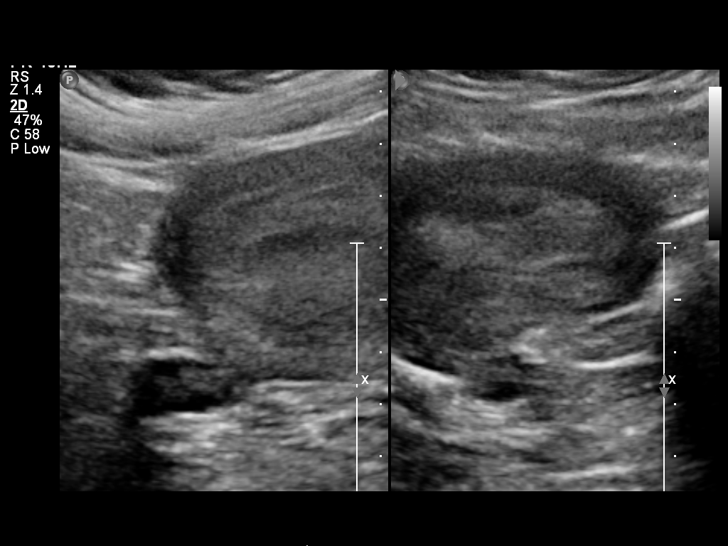
[im 9/22]
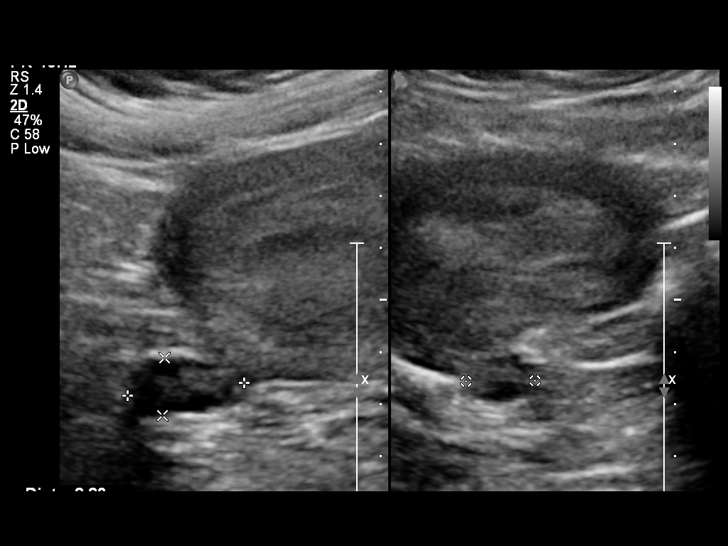
[im 11/22]
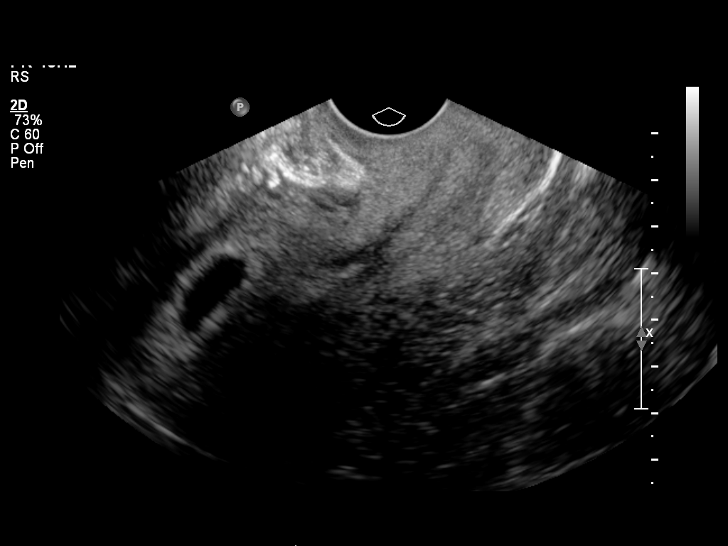
[im 12/22]
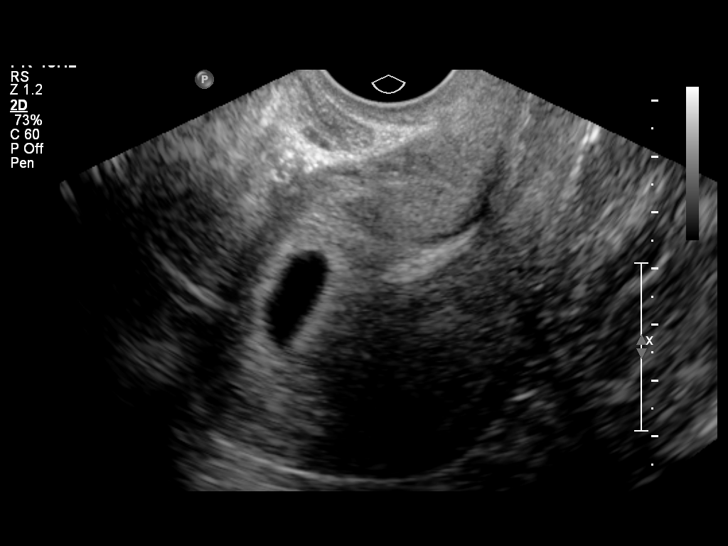
[im 14/22]
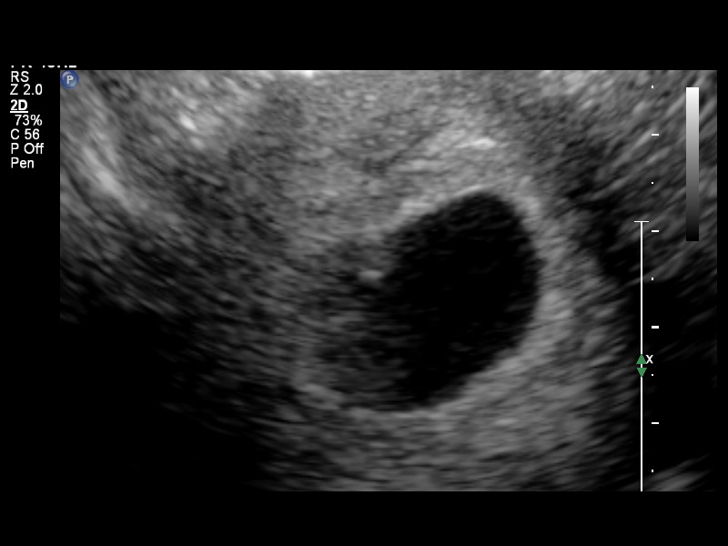
[im 15/22]
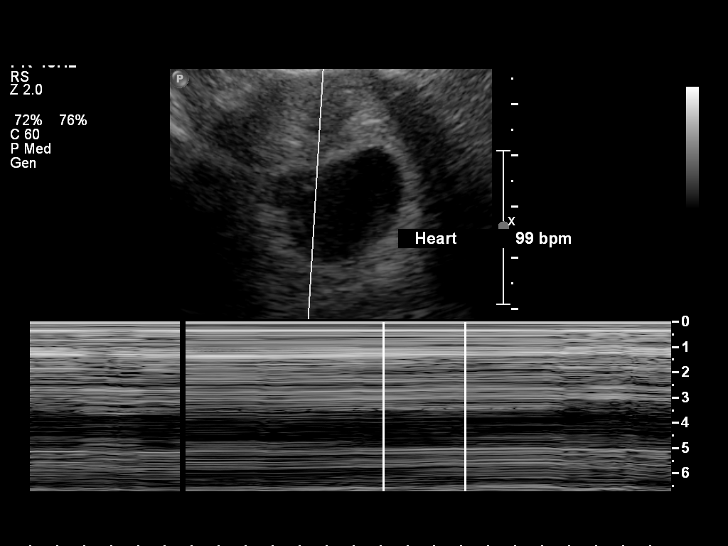
[im 17/22]
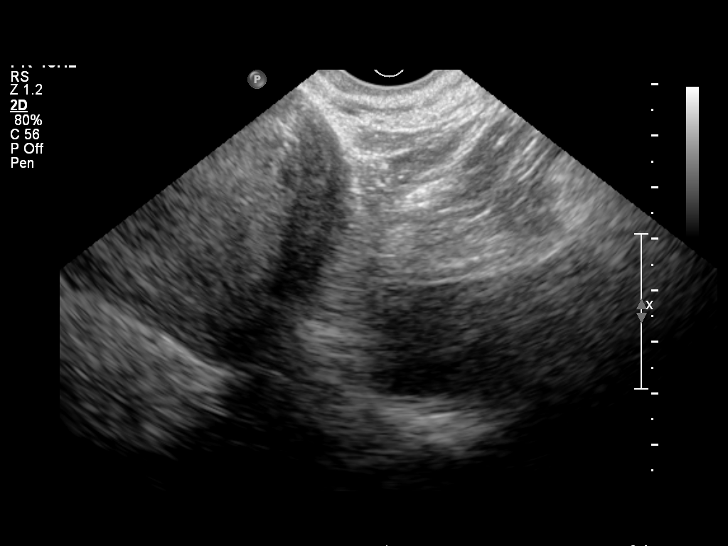
[im 19/22]
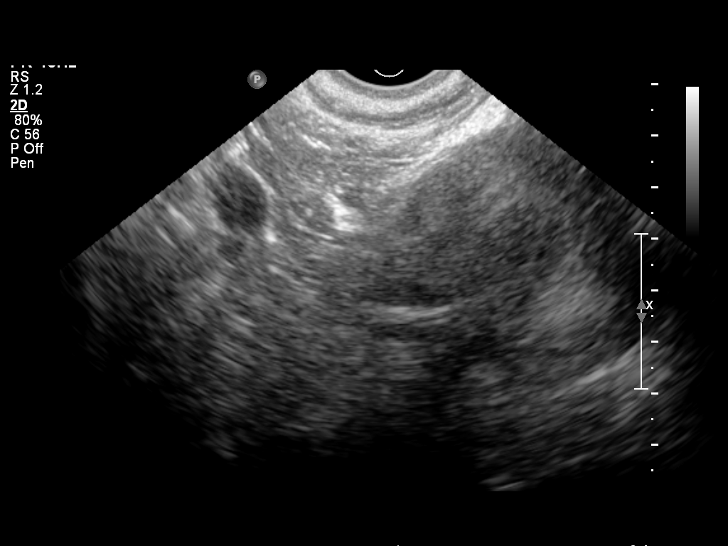
[im 20/22]
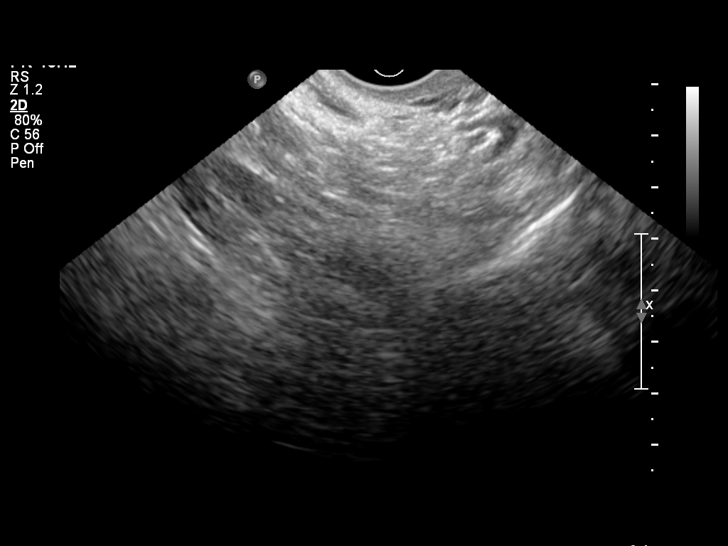
[im 22/22]
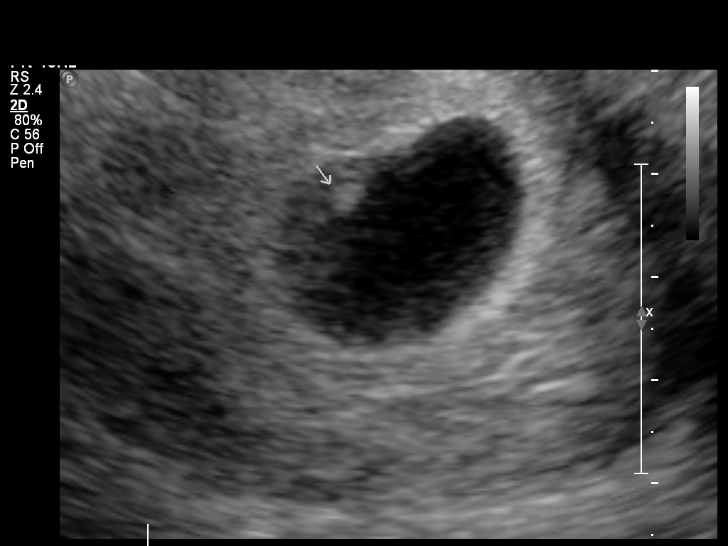

[14 of 22 positions shown; findings below may reference images not displayed]

Intrauterine gestational sac:  Visualized/normal in shape.
Yolk sac: Not identified
Embryo: Identified
Cardiac Activity: Identified
Heart Rate: 99 bpm

CRL: 3.5  mm  6 w  0 d        US EDC: 06/29/2012

Maternal uterus/adnexae:
Ovaries grossly within normal limits.  Only seen transabdominally.
No free fluid identified.
IMPRESSION: Single intrauterine gestation with cardiac activity documented.
Estimated age of 6 weeks 0 days by crown-rump length.

## 2013-12-03 ENCOUNTER — Encounter (HOSPITAL_COMMUNITY): Payer: Self-pay | Admitting: Obstetrics & Gynecology

## 2015-03-01 ENCOUNTER — Encounter (HOSPITAL_COMMUNITY): Payer: Self-pay | Admitting: *Deleted

## 2015-03-01 ENCOUNTER — Inpatient Hospital Stay (HOSPITAL_COMMUNITY): Payer: Self-pay

## 2015-03-01 ENCOUNTER — Inpatient Hospital Stay (HOSPITAL_COMMUNITY)
Admission: AD | Admit: 2015-03-01 | Discharge: 2015-03-01 | Disposition: A | Discharge status: Home / Self Care | Payer: Self-pay | Source: Ambulatory Visit | Attending: Obstetrics & Gynecology | Admitting: Obstetrics & Gynecology

## 2015-03-01 DIAGNOSIS — R1032 Left lower quadrant pain: Secondary | ICD-10-CM

## 2015-03-01 DIAGNOSIS — Z3202 Encounter for pregnancy test, result negative: Secondary | ICD-10-CM | POA: Insufficient documentation

## 2015-03-01 DIAGNOSIS — N83202 Unspecified ovarian cyst, left side: Secondary | ICD-10-CM | POA: Insufficient documentation

## 2015-03-01 DIAGNOSIS — F17210 Nicotine dependence, cigarettes, uncomplicated: Secondary | ICD-10-CM | POA: Insufficient documentation

## 2015-03-01 LAB — URINE MICROSCOPIC-ADD ON

## 2015-03-01 LAB — POCT PREGNANCY, URINE: PREG TEST UR: NEGATIVE

## 2015-03-01 LAB — URINALYSIS, ROUTINE W REFLEX MICROSCOPIC
Bilirubin Urine: NEGATIVE
GLUCOSE, UA: NEGATIVE mg/dL
Ketones, ur: NEGATIVE mg/dL
Nitrite: NEGATIVE
PROTEIN: NEGATIVE mg/dL
SPECIFIC GRAVITY, URINE: 1.015 (ref 1.005–1.030)
pH: 6 (ref 5.0–8.0)

## 2015-03-01 LAB — CBC
HEMATOCRIT: 35.8 % — AB (ref 36.0–46.0)
Hemoglobin: 12.4 g/dL (ref 12.0–15.0)
MCH: 29.9 pg (ref 26.0–34.0)
MCHC: 34.6 g/dL (ref 30.0–36.0)
MCV: 86.3 fL (ref 78.0–100.0)
Platelets: 263 10*3/uL (ref 150–400)
RBC: 4.15 MIL/uL (ref 3.87–5.11)
RDW: 12.7 % (ref 11.5–15.5)
WBC: 11.6 10*3/uL — ABNORMAL HIGH (ref 4.0–10.5)

## 2015-03-01 LAB — WET PREP, GENITAL
Clue Cells Wet Prep HPF POC: NONE SEEN
SPERM: NONE SEEN
Trich, Wet Prep: NONE SEEN
YEAST WET PREP: NONE SEEN

## 2015-03-01 LAB — HCG, QUANTITATIVE, PREGNANCY: hCG, Beta Chain, Quant, S: <1 m[IU]/mL (ref <5)

## 2015-03-01 MED ORDER — IBUPROFEN 800 MG PO TABS
800.0000 mg | ORAL_TABLET | Freq: Once | ORAL | Status: AC
  Administered 2015-03-01: 800 mg via ORAL
  Filled 2015-03-01: qty 1

## 2015-03-01 MED ORDER — TRAMADOL HCL 50 MG PO TABS: 50.0000 mg | ORAL_TABLET | Freq: Four times a day (QID) | ORAL | Status: DC | PRN

## 2015-03-01 NOTE — MAU Note (Addendum)
Pelvic pain L lower side for 3 wks. Missed period. Some clear discharge. My periods are usually 40 days. Had neg upt several wks ago. Everytime i eat i get nauseated

## 2015-03-01 NOTE — Discharge Instructions (Signed)
Ovarian Cyst An ovarian cyst is a fluid-filled sac that forms on an ovary. The ovaries are small organs that produce eggs in women. Various types of cysts can form on the ovaries. Most are not cancerous. Many do not cause problems, and they often go away on their own. Some may cause symptoms and require treatment. Common types of ovarian cysts include:    Functional cysts--These cysts may occur every month during the menstrual cycle. This is normal. The cysts usually go away with the next menstrual cycle if the woman does not get pregnant. Usually, there are no symptoms with a functional cyst.   ### This is the one you have     ####      Endometrioma cysts--These cysts form from the tissue that lines the uterus. They are also called "chocolate cysts" because they become filled with blood that turns brown. This type of cyst can cause pain in the lower abdomen during intercourse and with your menstrual period.  Cystadenoma cysts--This type develops from the cells on the outside of the ovary. These cysts can get very big and cause lower abdomen pain and pain with intercourse. This type of cyst can twist on itself, cut off its blood supply, and cause severe pain. It can also easily rupture and cause a lot of pain.  Dermoid cysts--This type of cyst is sometimes found in both ovaries. These cysts may contain different kinds of body tissue, such as skin, teeth, hair, or cartilage. They usually do not cause symptoms unless they get very big.  Theca lutein cysts--These cysts occur when too much of a certain hormone (human chorionic gonadotropin) is produced and overstimulates the ovaries to produce an egg. This is most common after procedures used to assist with the conception of a baby (in vitro fertilization). CAUSES   Fertility drugs can cause a condition in which multiple large cysts are formed on the ovaries. This is called ovarian hyperstimulation syndrome.  A condition called polycystic ovary  syndrome can cause hormonal imbalances that can lead to nonfunctional ovarian cysts. SIGNS AND SYMPTOMS  Many ovarian cysts do not cause symptoms. If symptoms are present, they may include:  Pelvic pain or pressure.  Pain in the lower abdomen.  Pain during sexual intercourse.  Increasing girth (swelling) of the abdomen.  Abnormal menstrual periods.  Increasing pain with menstrual periods.  Stopping having menstrual periods without being pregnant. DIAGNOSIS  These cysts are commonly found during a routine or annual pelvic exam. Tests may be ordered to find out more about the cyst. These tests may include:  Ultrasound.  X-ray of the pelvis.  CT scan.  MRI.  Blood tests. TREATMENT  Many ovarian cysts go away on their own without treatment. Your health care provider may want to check your cyst regularly for 2-3 months to see if it changes. For women in menopause, it is particularly important to monitor a cyst closely because of the higher rate of ovarian cancer in menopausal women. When treatment is needed, it may include any of the following:  A procedure to drain the cyst (aspiration). This may be done using a long needle and ultrasound. It can also be done through a laparoscopic procedure. This involves using a thin, lighted tube with a tiny camera on the end (laparoscope) inserted through a small incision.  Surgery to remove the whole cyst. This may be done using laparoscopic surgery or an open surgery involving a larger incision in the lower abdomen.  Hormone treatment or birth  control pills. These methods are sometimes used to help dissolve a cyst. HOME CARE INSTRUCTIONS   Only take over-the-counter or prescription medicines as directed by your health care provider.  Follow up with your health care provider as directed.  Get regular pelvic exams and Pap tests. SEEK MEDICAL CARE IF:   Your periods are late, irregular, or painful, or they stop.  Your pelvic pain or  abdominal pain does not go away.  Your abdomen becomes larger or swollen.  You have pressure on your bladder or trouble emptying your bladder completely.  You have pain during sexual intercourse.  You have feelings of fullness, pressure, or discomfort in your stomach.  You lose weight for no apparent reason.  You feel generally ill.  You become constipated.  You lose your appetite.  You develop acne.  You have an increase in body and facial hair.  You are gaining weight, without changing your exercise and eating habits.  You think you are pregnant. SEEK IMMEDIATE MEDICAL CARE IF:   You have increasing abdominal pain.  You feel sick to your stomach (nauseous), and you throw up (vomit).  You develop a fever that comes on suddenly.  You have abdominal pain during a bowel movement.  Your menstrual periods become heavier than usual. MAKE SURE YOU:  Understand these instructions.  Will watch your condition.  Will get help right away if you are not doing well or get worse.   This information is not intended to replace advice given to you by your health care provider. Make sure you discuss any questions you have with your health care provider.   Document Released: 01/18/2005 Document Revised: 01/23/2013 Document Reviewed: 09/25/2012 Elsevier Interactive Patient Education [employer]

## 2015-03-01 NOTE — MAU Provider Note (Signed)
History     CSN: [id number]  Arrival date and time: 03/01/15 1916   First Provider Initiated Contact with Patient 03/01/15 2022         Chief Complaint  Patient presents with  . Abdominal Pain   Abdominal Pain This is a new problem. Episode onset: 3 weeks ago. The problem occurs constantly. The problem has been waxing and waning. The pain is located in the LLQ. The pain is at a severity of 6/10. The quality of the pain is aching, sharp and cramping. The abdominal pain does not radiate. Associated symptoms include diarrhea (x 2 episodes today, none before then), frequency and nausea. Pertinent negatives include no constipation, dysuria, fever, hematuria or vomiting. Exacerbated by: sitting in upright position. Treatments tried: Aleve x 1 dose. The treatment provided no relief.  Female GU Problem The patient's primary symptoms include missed menses and vaginal discharge. The patient's pertinent negatives include no genital itching, genital odor or vaginal bleeding. She is not pregnant. Associated symptoms include abdominal pain, diarrhea (x 2 episodes today, none before then), frequency and nausea. Pertinent negatives include no constipation, dysuria, fever, flank pain, hematuria or vomiting. The vaginal discharge was copious, thin and white. There has been no bleeding. She is sexually active. No, her partner does not have an STD. She uses tubal ligation for contraception. Her menstrual history has been regular. There is no history of ovarian cysts or an STD.    OB History    Gravida Para Term Preterm AB TAB SAB Ectopic Multiple Living   Past Medical History  Diagnosis Date  . Personal history of kidney stones 2011  . Anxiety   . Depression   . Bipolar 1 disorder [facility])     Past Surgical History  Procedure Laterality Date  . Kidney stone surgery  12/2009  . Laparoscopic tubal ligation Bilateral 08/18/2012    Procedure: LAPAROSCOPIC TUBAL LIGATION;  Surgeon:  Mickel Baas, MD;  Location: [facility];  Service: Gynecology;  Laterality: Bilateral;    Family History  Problem Relation Age of Onset  . Anesthesia problems Neg Hx   . Hypotension Neg Hx   . Malignant hyperthermia Neg Hx   . Pseudochol deficiency Neg Hx   . Other Neg Hx   . Diabetes Maternal Uncle     Social History  Substance Use Topics  . Smoking status: Light Tobacco Smoker -- 1.00 packs/day for 8.5 years    Types: Cigarettes    Last Attempt to Quit: 10/31/2006  . Smokeless tobacco: Never Used  . Alcohol Use: No     Comment: stopped drinking in 2008    Allergies: No Known Allergies  Prescriptions prior to admission  Medication Sig Dispense Refill Last Dose  . naproxen sodium (ANAPROX) 220 MG tablet Take 440 mg by mouth 2 (two) times daily as needed (for headache/pain).   Past Month at Unknown time    Review of Systems  Constitutional: Negative for fever.  Gastrointestinal: Positive for nausea, abdominal pain and diarrhea (x 2 episodes today, none before then). Negative for vomiting and constipation.  Genitourinary: Positive for frequency, vaginal discharge and missed menses. Negative for dysuria, hematuria and flank pain.       No vaginal bleeding + vaginal discharge x 3 weeks No dyspareunia or post coital bleeding   Physical Exam   Blood pressure 134/72, pulse 85, temperature 98.5 F (36.9 C), resp. rate 18, height 5'  2" (1.575 m), weight 207 lb 12.8 oz (94.257 kg), last menstrual period 01/12/2015, unknown if currently breastfeeding.  Physical Exam  Nursing note and vitals reviewed. Constitutional: She is oriented to person, place, and time. She appears well-developed and well-nourished. No distress.  HENT:  Head: Normocephalic and atraumatic.  Eyes: Conjunctivae are normal. Right eye exhibits no discharge. Left eye exhibits no discharge. No scleral icterus.  Neck: Normal range of motion.  Cardiovascular: Normal rate, regular rhythm and normal heart sounds.    No murmur heard. Respiratory: Effort normal and breath sounds normal. No respiratory distress. She has no wheezes.  GI: Soft. Bowel sounds are normal. She exhibits no distension. There is tenderness in the left lower quadrant. There is no rigidity, no rebound, no guarding and no CVA tenderness.  Genitourinary: Uterus normal. Cervix exhibits discharge (small amount of thin white discharge). Cervix exhibits no motion tenderness. Right adnexum displays no mass and no fullness. Left adnexum displays tenderness.  Neurological: She is alert and oriented to person, place, and time.  Skin: Skin is warm and dry. She is not diaphoretic.  Psychiatric: She has a normal mood and affect. Her behavior is normal. Judgment and thought content normal.    MAU Course  Procedures Results for orders placed or performed during the hospital encounter of 03/01/15 (from the past 24 hour(s))  Urinalysis, Routine w reflex microscopic (not at [facility])     Status: Abnormal   Collection Time: 03/01/15  7:32 PM  Result Value Ref Range   Color, Urine YELLOW YELLOW   APPearance CLEAR CLEAR   Specific Gravity, Urine 1.015 1.005 - 1.030   pH 6.0 5.0 - 8.0   Glucose, UA NEGATIVE NEGATIVE mg/dL   Hgb urine dipstick SMALL (A) NEGATIVE   Bilirubin Urine NEGATIVE NEGATIVE   Ketones, ur NEGATIVE NEGATIVE mg/dL   Protein, ur NEGATIVE NEGATIVE mg/dL   Nitrite NEGATIVE NEGATIVE   Leukocytes, UA TRACE (A) NEGATIVE  Urine microscopic-add on     Status: Abnormal   Collection Time: 03/01/15  7:32 PM  Result Value Ref Range   Squamous Epithelial / LPF 0-5 (A) NONE SEEN   WBC, UA 0-5 0 - 5 WBC/hpf   RBC / HPF 0-5 0 - 5 RBC/hpf   Bacteria, UA RARE (A) NONE SEEN  Pregnancy, urine POC     Status: None   Collection Time: 03/01/15  7:55 PM  Result Value Ref Range   Preg Test, Ur NEGATIVE NEGATIVE  CBC     Status: Abnormal   Collection Time: 03/01/15  8:36 PM  Result Value Ref Range   WBC 11.6 (H) 4.0 - 10.5 K/uL   RBC 4.15 3.87 -  5.11 MIL/uL   Hemoglobin 12.4 12.0 - 15.0 g/dL   HCT 16.1 (L) 09.6 - 04.5 %   MCV 86.3 78.0 - 100.0 fL   MCH 29.9 26.0 - 34.0 pg   MCHC 34.6 30.0 - 36.0 g/dL   RDW 40.9 81.1 - 91.4 %   Platelets 263 150 - 400 K/uL    MDM UPT negative Patient very concerned d/t hx of BTL. States was told by previous doctor that if she ever missed a period she should go to emergency room immediately - will order BHCG to alleviate her concerns  Care turned over to [facility] CNM     Judeth Horn, NP 03/01/2015 9:08 PM   Assessment and Plan   Ultrasound results reviewed and are listed below.  US Transvaginal Non-ob  03/01/2015  CLINICAL DATA:  Left lower quadrant pain for 3 weeks. Negative urine pregnancy test. EXAM: TRANSABDOMINAL AND TRANSVAGINAL ULTRASOUND OF PELVIS TECHNIQUE: Both transabdominal and transvaginal ultrasound examinations of the pelvis were performed. Transabdominal technique was performed for global imaging of the pelvis including uterus, ovaries, adnexal regions, and pelvic cul-de-sac. It was necessary to proceed with endovaginal exam following the transabdominal exam to visualize the ovaries and endometrium. COMPARISON:  None FINDINGS: Uterus Measurements: 9.4 x 4.5 x 5.4 cm. Uterus is anteverted. No fibroids or other mass visualized. Endometrium Thickness: 12 mm. Trace fluid within the endometrium is nonspecific. Right ovary Measurements: 3.3 x 1.4 x 2 cm. Normal appearance/no adnexal mass. Right ovary is only visualized transabdominally. Left ovary Measurements: 4.8 x 3.9 x 4.1 cm. Dominant cyst demonstrated with thin septations, measuring about 4 x 3.3 x 3.1 cm in diameter. No flow is demonstrated within the septations. This likely represents a functional cyst. Based on size and appearance, no follow-up is indicated in a premenopausal patient. Other findings Small amount of free fluid in the pelvis. IMPRESSION: Trace fluid in the endometrium and mildly septated left ovarian cyst and  small amount of free fluid in the pelvis are most likely to be physiologic. Electronically Signed   By: Burman Nieves M.D.   On: 03/01/2015 22:06   US Pelvis Complete  03/01/2015  CLINICAL DATA:  Left lower quadrant pain for 3 weeks. Negative urine pregnancy test. EXAM: TRANSABDOMINAL AND TRANSVAGINAL ULTRASOUND OF PELVIS TECHNIQUE: Both transabdominal and transvaginal ultrasound examinations of the pelvis were performed. Transabdominal technique was performed for global imaging of the pelvis including uterus, ovaries, adnexal regions, and pelvic cul-de-sac. It was necessary to proceed with endovaginal exam following the transabdominal exam to visualize the ovaries and endometrium. COMPARISON:  None FINDINGS: Uterus Measurements: 9.4 x 4.5 x 5.4 cm. Uterus is anteverted. No fibroids or other mass visualized. Endometrium Thickness: 12 mm. Trace fluid within the endometrium is nonspecific. Right ovary Measurements: 3.3 x 1.4 x 2 cm. Normal appearance/no adnexal mass. Right ovary is only visualized transabdominally. Left ovary Measurements: 4.8 x 3.9 x 4.1 cm. Dominant cyst demonstrated with thin septations, measuring about 4 x 3.3 x 3.1 cm in diameter. No flow is demonstrated within the septations. This likely represents a functional cyst. Based on size and appearance, no follow-up is indicated in a premenopausal patient. Other findings Small amount of free fluid in the pelvis. IMPRESSION: Trace fluid in the endometrium and mildly septated left ovarian cyst and small amount of free fluid in the pelvis are most likely to be physiologic. Electronically Signed   By: Burman Nieves M.D.   On: 03/01/2015 22:06   Patient is more comfortable now Pain is down to a 4-5  Filed Vitals:   03/01/15 1927 03/01/15 2215  BP: 134/72 128/69  Pulse: 85 77  Temp: 98.5 F (36.9 C) 97.9 F (36.6 C)  Resp: 18 18  Height:  (1.575 m)   Weight: 207 lb 12.8 oz (94.257 kg)    Discussed ovarian cyst and expected  course Will give limited Rx of Tramadol #15, no refills Advised continue NSAIDS  Cyst will likely resolve on its own  Follow up as needed Come back or to Other ED if pain increases, or if other concerning symptoms arise Aviva Signs, CNM

## 2015-03-01 NOTE — Progress Notes (Signed)
Marie Williams CNM in earlier to discuss test results and d/c plan. Written and verbal d/c instructions given and understanding voiced °

## 2015-11-28 ENCOUNTER — Encounter (HOSPITAL_COMMUNITY): Payer: Self-pay

## 2015-11-28 ENCOUNTER — Inpatient Hospital Stay (HOSPITAL_COMMUNITY)
Admission: AD | Admit: 2015-11-28 | Discharge: 2015-11-29 | Disposition: A | Discharge status: Home / Self Care | Payer: BLUE CROSS/BLUE SHIELD | Source: Ambulatory Visit | Attending: Obstetrics and Gynecology | Admitting: Obstetrics and Gynecology

## 2015-11-28 DIAGNOSIS — R1032 Left lower quadrant pain: Secondary | ICD-10-CM | POA: Diagnosis not present

## 2015-11-28 DIAGNOSIS — N911 Secondary amenorrhea: Secondary | ICD-10-CM | POA: Insufficient documentation

## 2015-11-28 DIAGNOSIS — Z87891 Personal history of nicotine dependence: Secondary | ICD-10-CM | POA: Diagnosis not present

## 2015-11-28 LAB — WET PREP, GENITAL
Clue Cells Wet Prep HPF POC: NONE SEEN
Sperm: NONE SEEN
Trich, Wet Prep: NONE SEEN
Yeast Wet Prep HPF POC: NONE SEEN

## 2015-11-28 LAB — URINALYSIS, ROUTINE W REFLEX MICROSCOPIC
Bilirubin Urine: NEGATIVE
Glucose, UA: NEGATIVE mg/dL
Ketones, ur: NEGATIVE mg/dL
Nitrite: NEGATIVE
Protein, ur: NEGATIVE mg/dL
Specific Gravity, Urine: 1.02 (ref 1.005–1.030)
pH: 6 (ref 5.0–8.0)

## 2015-11-28 LAB — URINE MICROSCOPIC-ADD ON

## 2015-11-28 LAB — POCT PREGNANCY, URINE: Preg Test, Ur: NEGATIVE

## 2015-11-28 NOTE — MAU Note (Signed)
Left sided pelvic pain for the last month and feels like it is getting worse. Feels like a ball in that area. Left hip bone hurting for a week.  No bleeding. Period is 35 days late.  3 weeks ago took home UPT and it was neg.  Watery discharge for 2 months.

## 2015-11-28 NOTE — MAU Provider Note (Signed)
Chief Complaint: Abdominal Pain  First Provider Initiated Contact with Patient 11/28/15 2327      SUBJECTIVE HPI: Misty Gentry is a 30 y.o. [phone] female who presents to [facility] reporting LLQ pain x 1 month. Has Hx ovarian cyst. Late for period, but has irreg cycles. Neg home UPT. Doubts pregnancy due to having has BTL. Increased, thin discharge. Does not have a Gyn.    Location: LLQ Quality: Sharp, feels like a ball Severity: 4/10 on pain scale Duration: 1 month Context: None Timing: intermittent Modifying factors: Improves w/ Aleve Associated signs and symptoms: Pos fr vaginal discharge. Neg for fever, chills, vaginal bleeding urinary complaints, GI complaints, dyspareunia.   Past Medical History:  Diagnosis Date  . Anxiety   . Bipolar 1 disorder (HCC)   . Depression   . Personal history of kidney stones 2011   OB History  Gravida Para Term Preterm AB Living  5 4 4   1 4   SAB TAB Ectopic Multiple Live Births  1       4    # Outcome Date GA Lbr Len/2nd Weight Sex Delivery Anes PTL Lv  5 Term 06/23/12 5651w1d / 00:30 8 lb 12.6 oz (3.985 kg) F Vag-Spont   LIV  4 Term 11/27/10 7973w3d 13:40 / 00:02 7 lb 11.3 oz (3.496 kg) F Vag-Spont None  LIV  3 SAB           2 Term  3726w0d  8 lb 7 oz (3.827 kg) F Vag-Spont EPI  LIV  1 Term  5473w0d 18:00 8 lb (3.629 kg) F Vag-Vacuum EPI  LIV     Past Surgical History:  Procedure Laterality Date  . KIDNEY STONE SURGERY  12/2009  . LAPAROSCOPIC TUBAL LIGATION Bilateral 08/18/2012   Procedure: LAPAROSCOPIC TUBAL LIGATION;  Surgeon: Mickel Baasichard D Kaplan, MD;  Location: [facility];  Service: Gynecology;  Laterality: Bilateral;   Social History   Social History  . Marital status: Married    Spouse name: N/A  . Number of children: N/A  . Years of education: N/A   Occupational History  . Not on file.   Social History Main Topics  . Smoking status: Former Smoker    Packs/day: 1.00    Years: 8.50    Types: Cigarettes    Quit date:  10/31/2006  . Smokeless tobacco: Never Used  . Alcohol use No     Comment: stopped drinking in 2008  . Drug use: No     Comment: Pt has H/O drug abuse (stopped in 2008); "[place] Crystalmeth"  . Sexual activity: Yes    Birth control/ protection: Surgical   Other Topics Concern  . Not on file   Social History Narrative  . No narrative on file   Family History  Problem Relation Age of Onset  . Diabetes Maternal Uncle   . Anesthesia problems Neg Hx   . Hypotension Neg Hx   . Malignant hyperthermia Neg Hx   . Pseudochol deficiency Neg Hx   . Other Neg Hx    No current facility-administered medications on file prior to encounter.    Current Outpatient Prescriptions on File Prior to Encounter  Medication Sig Dispense Refill  . naproxen sodium (ANAPROX) 220 MG tablet Take 440 mg by mouth 2 (two) times daily as needed (for headache/pain).    . traMADol (ULTRAM) 50 MG tablet Take 1 tablet (50 mg total) by mouth every 6 (six) hours as needed. 15 tablet 0   No Known Allergies  I have reviewed patient's Past Medical Hx, Surgical Hx, Family Hx, Social Hx, medications and allergies.   Review of Systems  Constitutional: Negative for appetite change, chills and fever.  Gastrointestinal: Positive for abdominal pain. Negative for abdominal distention, constipation, diarrhea, nausea and vomiting.  Genitourinary: Positive for vaginal discharge. Negative for dyspareunia, dysuria, flank pain, frequency, pelvic pain, vaginal bleeding and vaginal pain.  Musculoskeletal: Negative for back pain.  Neurological: Negative for dizziness.    OBJECTIVE Patient Vitals for the past 24 hrs:  BP Temp Temp src Pulse Resp SpO2 Height Weight  11/28/15 2253 126/75 98.2 F (36.8 C) Oral 90 16 99 % - -  11/28/15 2241 - - - - - - 5\' 2"  (1.575 m) 221 lb (100.2 kg)   Constitutional: Well-developed, well-nourished female in no acute distress.  Cardiovascular: normal rate Respiratory: normal rate and effort.   GI: Abd soft, mild, focal left groin tenderness. Pos BS x 4 MS: Extremities nontender, no edema, normal ROM Neurologic: Alert and oriented x 4.  GU: Neg CVAT.  SPECULUM EXAM: NEFG, physiologic discharge, no blood noted, cervix clean  BIMANUAL: cervix closed; uterus normal size, no adnexal tenderness or masses. No CMT.  LAB RESULTS Results for orders placed or performed during the hospital encounter of 11/28/15 (from the past 24 hour(s))  Urinalysis, Routine w reflex microscopic (not at [facility])     Status: Abnormal   Collection Time: 11/28/15 10:38 PM  Result Value Ref Range   Color, Urine YELLOW YELLOW   APPearance CLEAR CLEAR   Specific Gravity, Urine 1.020 1.005 - 1.030   pH 6.0 5.0 - 8.0   Glucose, UA NEGATIVE NEGATIVE mg/dL   Hgb urine dipstick SMALL (A) NEGATIVE   Bilirubin Urine NEGATIVE NEGATIVE   Ketones, ur NEGATIVE NEGATIVE mg/dL   Protein, ur NEGATIVE NEGATIVE mg/dL   Nitrite NEGATIVE NEGATIVE   Leukocytes, UA TRACE (A) NEGATIVE  Urine microscopic-add on     Status: Abnormal   Collection Time: 11/28/15 10:38 PM  Result Value Ref Range   Squamous Epithelial / LPF 0-5 (A) NONE SEEN   WBC, UA 0-5 0 - 5 WBC/hpf   RBC / HPF 6-30 0 - 5 RBC/hpf   Bacteria, UA FEW (A) NONE SEEN  Pregnancy, urine POC     Status: None   Collection Time: 11/28/15 11:15 PM  Result Value Ref Range   Preg Test, Ur NEGATIVE NEGATIVE  Wet prep, genital     Status: Abnormal   Collection Time: 11/28/15 11:30 PM  Result Value Ref Range   Yeast Wet Prep HPF POC NONE SEEN NONE SEEN   Trich, Wet Prep NONE SEEN NONE SEEN   Clue Cells Wet Prep HPF POC NONE SEEN NONE SEEN   WBC, Wet Prep HPF POC FEW (A) NONE SEEN   Sperm NONE SEEN   CBC     Status: Abnormal   Collection Time: 11/28/15 11:50 PM  Result Value Ref Range   WBC 11.2 (H) 4.0 - 10.5 K/uL   RBC 4.16 3.87 - 5.11 MIL/uL   Hemoglobin 12.5 12.0 - 15.0 g/dL   HCT 86.5 (L) 78.4 - 69.6 %   MCV 85.6 78.0 - 100.0 fL   MCH 30.0 26.0 - 34.0 pg    MCHC 35.1 30.0 - 36.0 g/dL   RDW 29.5 28.4 - 13.2 %   Platelets 247 150 - 400 K/uL    IMAGING No results found.  MAU COURSE Orders Placed This Encounter  Procedures  . Wet prep, genital  .  Urine culture  . US Pelvis Complete  . US Transvaginal Non-OB  . Urinalysis, Routine w reflex microscopic (not at [facility])  . CBC  . HIV antibody (routine testing) (NOT for [facility])  . Urine microscopic-add on  . Pregnancy, urine POC  . Discharge patient   Meds ordered this encounter  Medications  . acetaminophen (TYLENOL) 325 MG tablet    Sig: Take 650 mg by mouth every 6 (six) hours as needed.    MDM - LLQ pain. Suspect ovarian cyst. Appropriate for OP [place]. Low suspicion for torsion, infection or other emergent condition.   ASSESSMENT 1. LLQ pain   2. Secondary amenorrhea     PLAN Discharge home in stable condition. Abd pain and torsion precautions GC/Chlamydia cultures pending.  Follow-up Information    THE [facility] OF [place] ULTRASOUND .   Specialty:  Radiology Why:  Will call you to schedule pelvic ultrasound Contact information: [address] [id number] mc [place] [address] [phone]       THE [facility] OF [place] MATERNITY ADMISSIONS .   Why:  For Ob/Gyn emergencies Contact information: [address] [id number] mc [place] [address] [phone]       Gynecologist of your choice .   Why:  For routine and non-emergent gynecology care           Medication List    STOP taking these medications   traMADol 50 MG tablet Commonly known as:  ULTRAM     TAKE these medications   acetaminophen 325 MG tablet Commonly known as:  TYLENOL Take 650 mg by mouth every 6 (six) hours as needed.   naproxen sodium 220 MG tablet Commonly known as:  ANAPROX Take 440 mg by mouth 2 (two) times daily as needed (for headache/pain).        [place], CNM 11/29/2015  1:09 AM

## 2015-11-29 DIAGNOSIS — R1032 Left lower quadrant pain: Secondary | ICD-10-CM | POA: Diagnosis not present

## 2015-11-29 LAB — CBC
HCT: 35.6 % — ABNORMAL LOW (ref 36.0–46.0)
HEMOGLOBIN: 12.5 g/dL (ref 12.0–15.0)
MCH: 30 pg (ref 26.0–34.0)
MCHC: 35.1 g/dL (ref 30.0–36.0)
MCV: 85.6 fL (ref 78.0–100.0)
PLATELETS: 247 10*3/uL (ref 150–400)
RBC: 4.16 MIL/uL (ref 3.87–5.11)
RDW: 12.6 % (ref 11.5–15.5)
WBC: 11.2 10*3/uL — ABNORMAL HIGH (ref 4.0–10.5)

## 2015-11-29 LAB — HIV ANTIBODY (ROUTINE TESTING W REFLEX): HIV SCREEN 4TH GENERATION: NONREACTIVE

## 2015-11-29 NOTE — Discharge Instructions (Signed)
Secondary Amenorrhea Secondary amenorrhea is the stopping of menstrual flow for 3-6 months in a female who has previously had periods. There are many possible causes. Most of these causes are not serious. Usually, treating the underlying problem causing the loss of menses will return your periods to normal. CAUSES  Some common and uncommon causes of not menstruating include:  Malnutrition.  Low blood sugar (hypoglycemia).  Polycystic ovary disease.  Stress or fear.  Breastfeeding.  Hormone imbalance.  Ovarian failure.  Medicines.  Extreme obesity.  Cystic fibrosis.  Low body weight or drastic weight reduction from any cause.  Early menopause.  Removal of ovaries or uterus.  Contraceptives.  Illness.  Long-term (chronic) illnesses.  Cushing syndrome.  Thyroid problems.  Birth control pills, patches, or vaginal rings for birth control. RISK FACTORS You may be at greater risk of secondary amenorrhea if:  You have a family history of this condition.  You have an eating disorder.  You do athletic training. DIAGNOSIS  A diagnosis is made by your health care provider taking a medical history and doing a physical exam. This will include a pelvic exam to check for problems with your reproductive organs. Pregnancy must be ruled out. Often, numerous blood tests are done to measure different hormones in the body. Urine testing may be done. Specialized exams (ultrasound, CT scan, MRI, or hysteroscopy) may have to be done as well as measuring the body mass index (BMI). TREATMENT  Treatment depends on the cause of the amenorrhea. If an eating disorder is present, this can be treated with an adequate diet and therapy. Chronic illnesses may improve with treatment of the illness. Amenorrhea may be corrected with medicines, lifestyle changes, or surgery. If the amenorrhea cannot be corrected, it is sometimes possible to create a false menstruation with medicines. HOME CARE  INSTRUCTIONS  Maintain a healthy diet.  Manage weight problems.  Exercise regularly but not excessively.  Get adequate sleep.  Manage stress.  Be aware of changes in your menstrual cycle. Keep a record of when your periods occur. Note the date your period starts, how long it lasts, and any problems. SEEK MEDICAL CARE IF: Your symptoms do not get better with treatment.   This information is not intended to replace advice given to you by your health care provider. Make sure you discuss any questions you have with your health care provider.   Document Released: 03/01/2006 Document Revised: 02/08/2014 Document Reviewed: 07/06/2012 Elsevier Interactive Patient Education 2016 Elsevier Inc[employer]   Pelvic Pain, Female Female pelvic pain can be caused by many different things and start from a variety of places. Pelvic pain refers to pain that is located in the lower half of the abdomen and between your hips. The pain may occur over a short period of time (acute) or may be reoccurring (chronic). The cause of pelvic pain may be related to disorders affecting the female reproductive organs (gynecologic), but it may also be related to the bladder, kidney stones, an intestinal complication, or muscle or skeletal problems. Getting help right away for pelvic pain is important, especially if there has been severe, sharp, or a sudden onset of unusual pain. It is also important to get help right away because some types of pelvic pain can be life threatening.  CAUSES  Below are only some of the causes of pelvic pain. The causes of pelvic pain can be in one of several categories.   Gynecologic.  Pelvic inflammatory disease.  Sexually transmitted infection.  Ovarian cyst or  a twisted ovarian ligament (ovarian torsion).  Uterine lining that grows outside the uterus (endometriosis).  Fibroids, cysts, or tumors.  Ovulation.  Pregnancy.  Pregnancy that occurs outside the uterus (ectopic  pregnancy).  Miscarriage.  Labor.  Abruption of the placenta or ruptured uterus.  Infection.  Uterine infection (endometritis).  Bladder infection.  Diverticulitis.  Miscarriage related to a uterine infection (septic abortion).  Bladder.  Inflammation of the bladder (cystitis).  Kidney stone(s).  Gastrointestinal.  Constipation.  Diverticulitis.  Neurologic.  Trauma.  Feeling pelvic pain because of mental or emotional causes (psychosomatic).  Cancers of the bowel or pelvis. EVALUATION  Your caregiver will want to take a careful history of your concerns. This includes recent changes in your health, a careful gynecologic history of your periods (menses), and a sexual history. Obtaining your family history and medical history is also important. Your caregiver may suggest a pelvic exam. A pelvic exam will help identify the location and severity of the pain. It also helps in the evaluation of which organ system may be involved. In order to identify the cause of the pelvic pain and be properly treated, your caregiver may order tests. These tests may include:   A pregnancy test.  Pelvic ultrasonography.  An X-ray exam of the abdomen.  A urinalysis or evaluation of vaginal discharge.  Blood tests. HOME CARE INSTRUCTIONS   Only take over-the-counter or prescription medicines for pain, discomfort, or fever as directed by your caregiver.   Rest as directed by your caregiver.   Eat a balanced diet.   Drink enough fluids to make your urine clear or pale yellow, or as directed.   Avoid sexual intercourse if it causes pain.   Apply warm or cold compresses to the lower abdomen depending on which one helps the pain.   Avoid stressful situations.   Keep a journal of your pelvic pain. Write down when it started, where the pain is located, and if there are things that seem to be associated with the pain, such as food or your menstrual cycle.  Follow up with your  caregiver as directed.  SEEK MEDICAL CARE IF:  Your medicine does not help your pain.  You have abnormal vaginal discharge. SEEK IMMEDIATE MEDICAL CARE IF:   You have heavy bleeding from the vagina.   Your pelvic pain increases.   You feel light-headed or faint.   You have chills.   You have pain with urination or blood in your urine.   You have uncontrolled diarrhea or vomiting.   You have a fever or persistent symptoms for more than 3 days.  You have a fever and your symptoms suddenly get worse.   You are being physically or sexually abused.   This information is not intended to replace advice given to you by your health care provider. Make sure you discuss any questions you have with your health care provider.   Document Released: 12/16/2003 Document Revised: 10/09/2014 Document Reviewed: 05/10/2011 Elsevier Interactive Patient Education 2016 [employer]   [facility]    [facility]       Phone: [phone]  [facility] [place] Phone: [phone]  [facility] [place]  Phone: [phone]  [facility] [place] [place]  Phone: [phone]  [facility] [place]  Phone: [phone]  [facility]       Phone: [phone]  [facility]  Phone: [phone]   [facility]([place]    Phone: [phone]  [facility]    Phone: [phone]  [facility]    Phone: [phone]  [facility]    Phone: [phone]  [facility]       Phone: [phone]   [facility]  Phone: [phone]  [facility]    Phone: [phone]  [facility]   Phone: [phone]  [facility]      Phone: [phone]  [facility]    Phone: [phone]

## 2015-11-30 LAB — URINE CULTURE

## 2015-12-01 LAB — GC/CHLAMYDIA PROBE AMP (~~LOC~~) NOT AT ARMC
CHLAMYDIA, DNA PROBE: NEGATIVE
NEISSERIA GONORRHEA: NEGATIVE

## 2016-06-06 IMAGING — US US TRANSVAGINAL NON-OB
1 series · 15 of 25 positions shown · non-contrast
Comparison: None

CLINICAL DATA: Left lower quadrant pain for 3 weeks. Negative urine
pregnancy test.

EXAM:
TRANSABDOMINAL AND TRANSVAGINAL ULTRASOUND OF PELVIS
TECHNIQUE: Both transabdominal and transvaginal ultrasound examinations of the
pelvis were performed. Transabdominal technique was performed for
global imaging of the pelvis including uterus, ovaries, adnexal
regions, and pelvic cul-de-sac. It was necessary to proceed with
endovaginal exam following the transabdominal exam to visualize the
ovaries and endometrium.

[Series 1: us transvaginal non-ob · 15 of 50 slices shown]
[im 1/50]
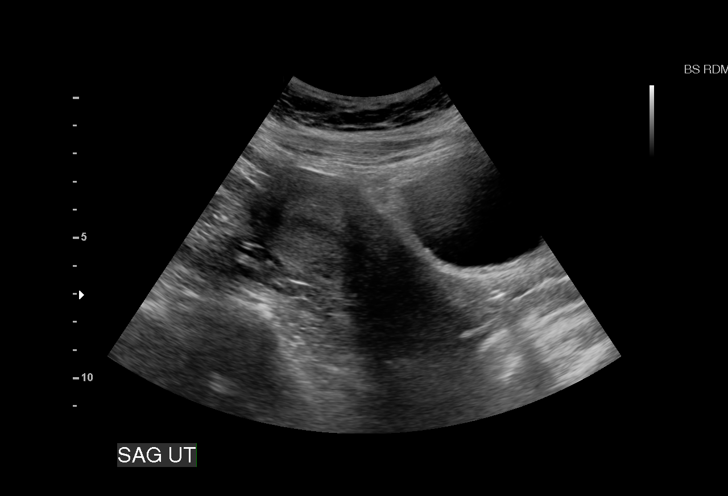
[im 5/50]
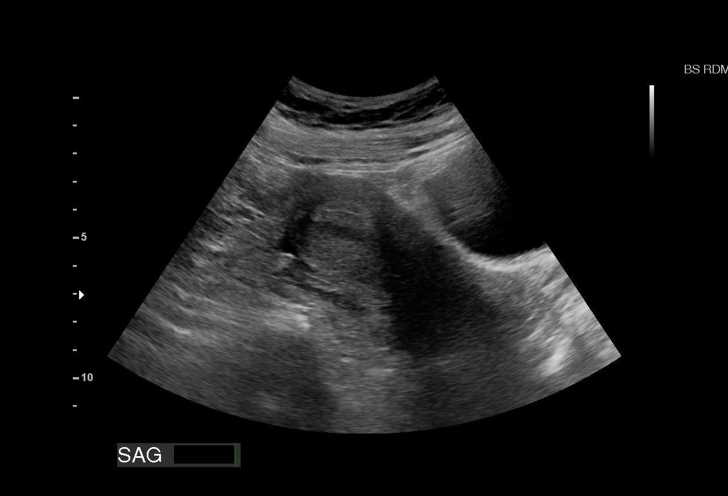
[im 9/50]
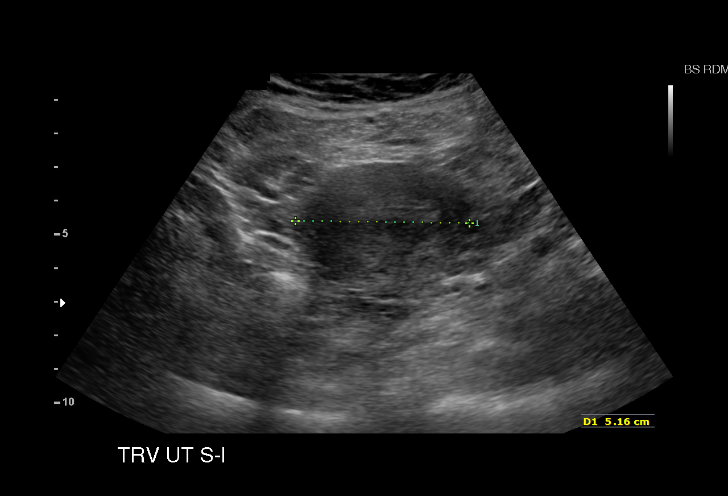
[im 11/50]
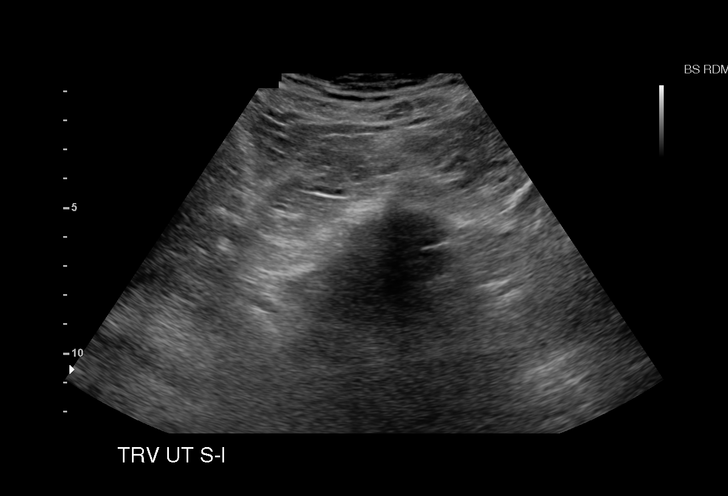
[im 15/50]
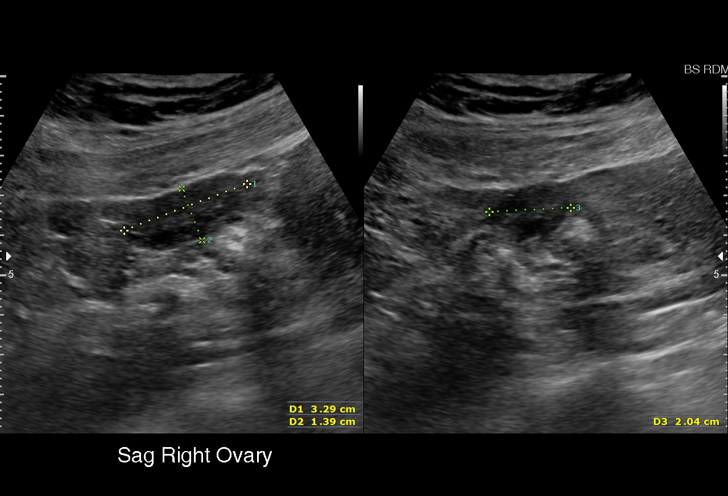
[im 19/50]
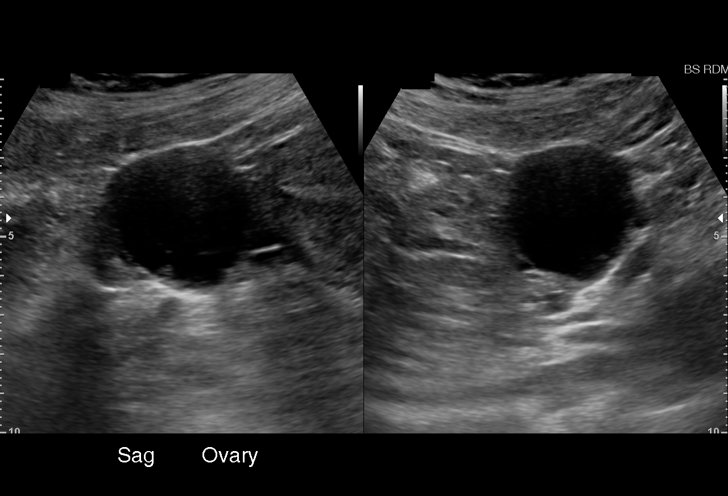
[im 21/50]
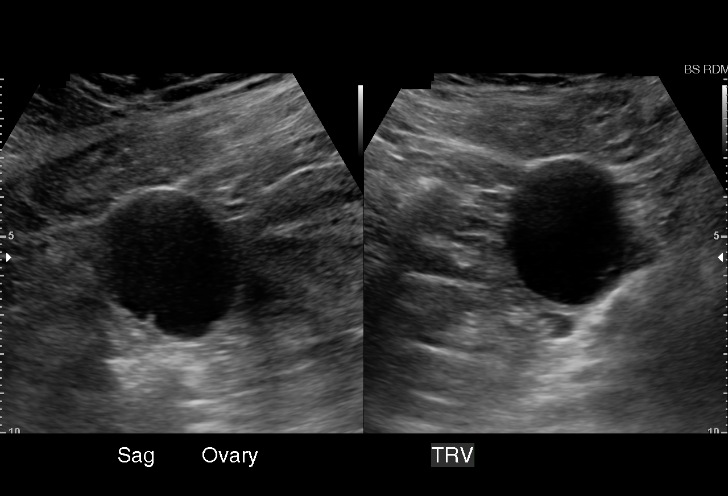
[im 25/50]
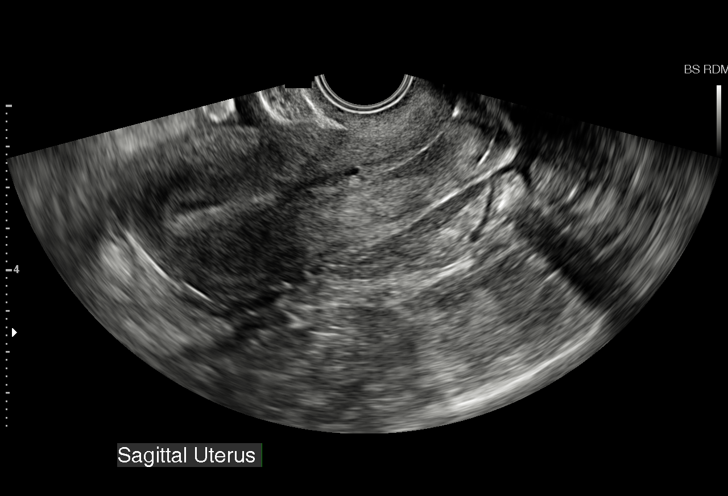
[im 29/50]
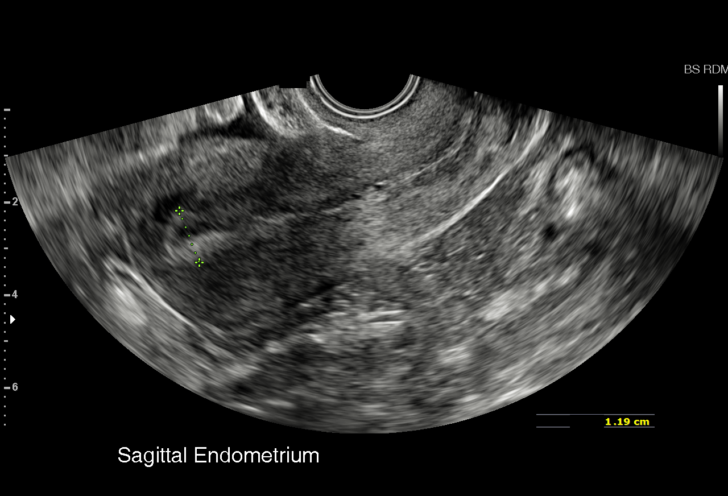
[im 31/50]
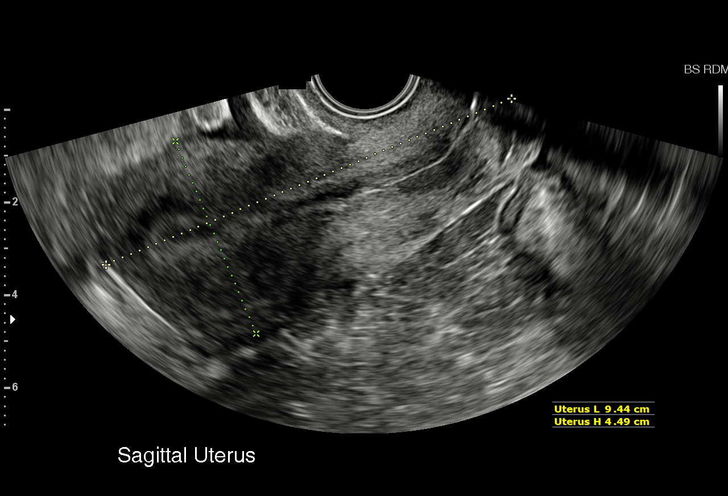
[im 35/50]
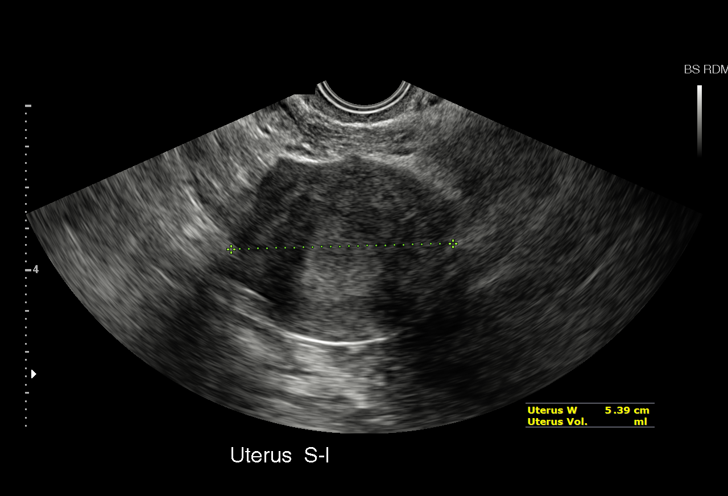
[im 39/50]
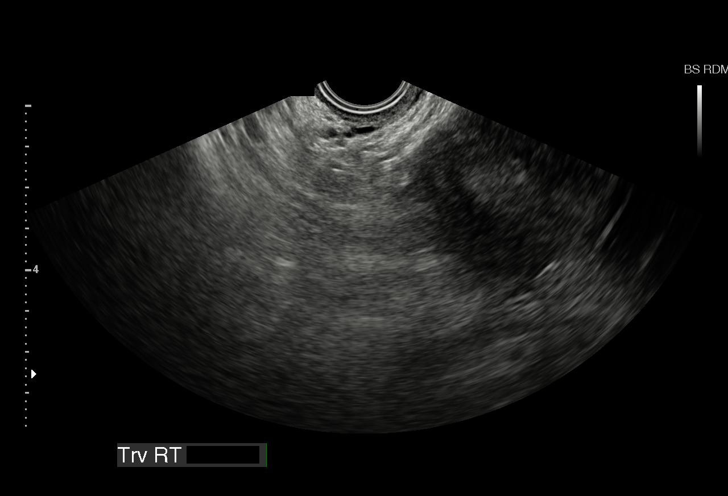
[im 41/50]
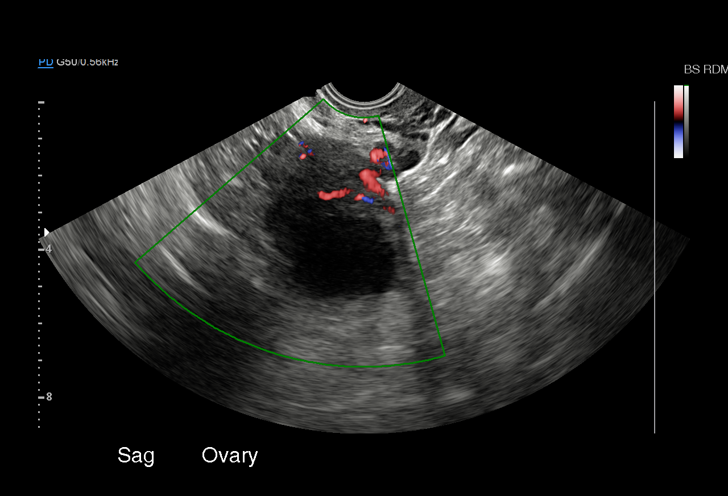
[im 45/50]
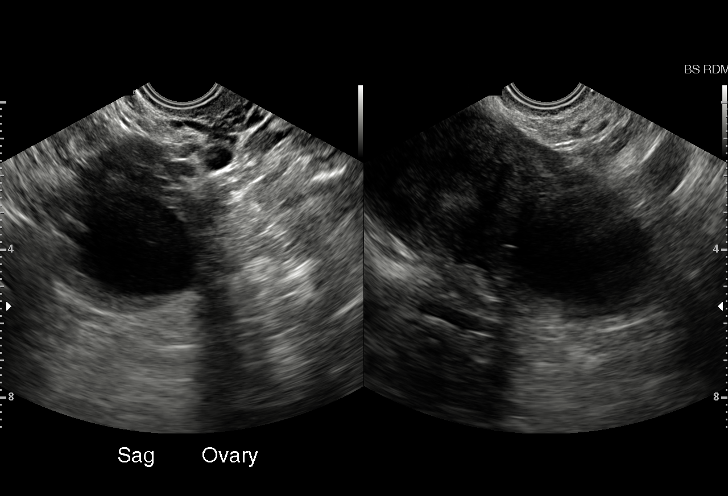
[im 50/50]
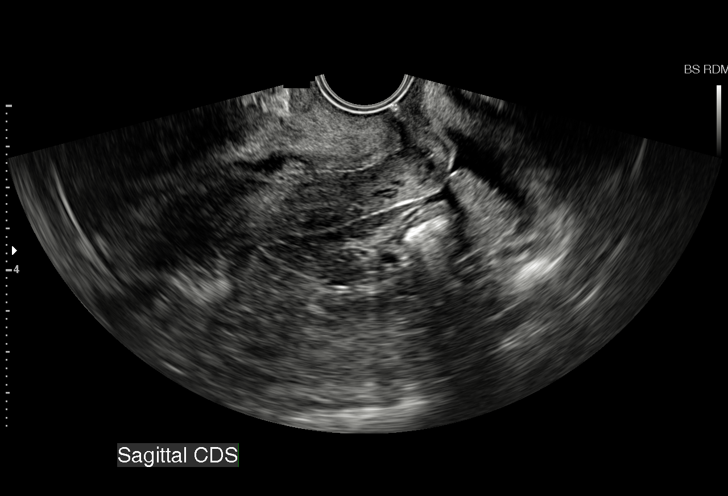

[15 of 25 positions shown; findings below may reference images not displayed]

FINDINGS: Uterus

Measurements: 9.4 x 4.5 x 5.4 cm. Uterus is anteverted. No fibroids
or other mass visualized.

Endometrium

Thickness: 12 mm. Trace fluid within the endometrium is nonspecific.

Right ovary

Measurements: 3.3 x 1.4 x 2 cm. Normal appearance/no adnexal mass.
Right ovary is only visualized transabdominally.

Left ovary

Measurements: 4.8 x 3.9 x 4.1 cm. Dominant cyst demonstrated with
thin septations, measuring about 4 x 3.3 x 3.1 cm in diameter. No
flow is demonstrated within the septations. This likely represents a
functional cyst. Based on size and appearance, no follow-up is
indicated in a premenopausal patient.

Other findings

Small amount of free fluid in the pelvis.
IMPRESSION: Trace fluid in the endometrium and mildly septated left ovarian cyst
and small amount of free fluid in the pelvis are most likely to be
physiologic.

## 2019-08-14 ENCOUNTER — Other Ambulatory Visit: Payer: Self-pay | Admitting: Physician Assistant

## 2019-08-14 DIAGNOSIS — N644 Mastodynia: Secondary | ICD-10-CM

## 2019-08-28 ENCOUNTER — Other Ambulatory Visit: Payer: BLUE CROSS/BLUE SHIELD
